# Patient Record
Sex: Female | Born: 1960 | Race: White | Hispanic: No | Marital: Married | Smoking: Never smoker
Health system: Southern US, Community
[De-identification: ages and names within clinical notes are randomized; demographics above are authoritative.]

## PROBLEM LIST (undated history)

## (undated) DIAGNOSIS — R002 Palpitations: Secondary | ICD-10-CM

## (undated) DIAGNOSIS — R112 Nausea with vomiting, unspecified: Secondary | ICD-10-CM

## (undated) DIAGNOSIS — R0982 Postnasal drip: Secondary | ICD-10-CM

## (undated) DIAGNOSIS — K5730 Diverticulosis of large intestine without perforation or abscess without bleeding: Secondary | ICD-10-CM

## (undated) DIAGNOSIS — M1990 Unspecified osteoarthritis, unspecified site: Secondary | ICD-10-CM

## (undated) DIAGNOSIS — I8390 Asymptomatic varicose veins of unspecified lower extremity: Secondary | ICD-10-CM

## (undated) DIAGNOSIS — T8859XA Other complications of anesthesia, initial encounter: Secondary | ICD-10-CM

## (undated) DIAGNOSIS — M810 Age-related osteoporosis without current pathological fracture: Secondary | ICD-10-CM

## (undated) DIAGNOSIS — Z9889 Other specified postprocedural states: Secondary | ICD-10-CM

## (undated) DIAGNOSIS — T4145XA Adverse effect of unspecified anesthetic, initial encounter: Secondary | ICD-10-CM

## (undated) DIAGNOSIS — Z780 Asymptomatic menopausal state: Secondary | ICD-10-CM

## (undated) DIAGNOSIS — M21611 Bunion of right foot: Secondary | ICD-10-CM

## (undated) DIAGNOSIS — J40 Bronchitis, not specified as acute or chronic: Secondary | ICD-10-CM

## (undated) DIAGNOSIS — R51 Headache: Secondary | ICD-10-CM

## (undated) HISTORY — PX: BUNIONECTOMY: SHX129

## (undated) HISTORY — DX: Diverticulosis of large intestine without perforation or abscess without bleeding: K57.30

## (undated) HISTORY — DX: Unspecified osteoarthritis, unspecified site: M19.90

## (undated) HISTORY — DX: Asymptomatic menopausal state: Z78.0

## (undated) HISTORY — DX: Asymptomatic varicose veins of unspecified lower extremity: I83.90

## (undated) HISTORY — PX: COLONOSCOPY: SHX174

## (undated) HISTORY — DX: Headache: R51

## (undated) HISTORY — DX: Palpitations: R00.2

## (undated) HISTORY — DX: Age-related osteoporosis without current pathological fracture: M81.0

---

## 2003-05-02 HISTORY — PX: ESOPHAGOGASTRODUODENOSCOPY: SHX1529

## 2004-10-01 ENCOUNTER — Ambulatory Visit: Payer: Self-pay

## 2005-02-12 ENCOUNTER — Ambulatory Visit: Payer: Self-pay | Admitting: Family Medicine

## 2005-10-02 ENCOUNTER — Ambulatory Visit: Payer: Self-pay

## 2005-12-30 ENCOUNTER — Ambulatory Visit: Payer: Self-pay | Admitting: Unknown Physician Specialty

## 2006-10-08 ENCOUNTER — Ambulatory Visit: Payer: Self-pay

## 2007-07-06 ENCOUNTER — Other Ambulatory Visit: Payer: Self-pay

## 2007-07-06 ENCOUNTER — Observation Stay: Payer: Self-pay | Admitting: Internal Medicine

## 2007-10-21 ENCOUNTER — Ambulatory Visit: Payer: Self-pay

## 2008-03-04 DIAGNOSIS — K5730 Diverticulosis of large intestine without perforation or abscess without bleeding: Secondary | ICD-10-CM

## 2008-03-04 HISTORY — DX: Diverticulosis of large intestine without perforation or abscess without bleeding: K57.30

## 2008-03-04 HISTORY — PX: COLON SURGERY: SHX602

## 2008-07-23 ENCOUNTER — Emergency Department: Payer: Self-pay | Admitting: Emergency Medicine

## 2008-08-08 ENCOUNTER — Ambulatory Visit: Payer: Self-pay | Admitting: Unknown Physician Specialty

## 2008-10-20 ENCOUNTER — Ambulatory Visit: Payer: Self-pay | Admitting: Unknown Physician Specialty

## 2008-11-30 ENCOUNTER — Ambulatory Visit: Payer: Self-pay

## 2009-01-09 ENCOUNTER — Ambulatory Visit: Payer: Self-pay | Admitting: Surgery

## 2009-01-16 ENCOUNTER — Inpatient Hospital Stay: Payer: Self-pay | Admitting: Surgery

## 2009-11-14 ENCOUNTER — Ambulatory Visit: Payer: Self-pay | Admitting: Family Medicine

## 2009-12-25 ENCOUNTER — Ambulatory Visit: Payer: Self-pay

## 2010-01-08 ENCOUNTER — Ambulatory Visit: Payer: Self-pay

## 2011-02-05 ENCOUNTER — Ambulatory Visit: Payer: Self-pay

## 2011-02-06 ENCOUNTER — Ambulatory Visit: Payer: Self-pay

## 2011-10-08 ENCOUNTER — Ambulatory Visit: Payer: Self-pay | Admitting: Family Medicine

## 2012-06-15 ENCOUNTER — Ambulatory Visit: Payer: Self-pay | Admitting: Family Medicine

## 2013-02-26 ENCOUNTER — Ambulatory Visit: Payer: Self-pay | Admitting: Family Medicine

## 2013-03-29 ENCOUNTER — Ambulatory Visit (INDEPENDENT_AMBULATORY_CARE_PROVIDER_SITE_OTHER): Payer: 59 | Admitting: Cardiovascular Disease

## 2013-03-29 ENCOUNTER — Encounter: Payer: Self-pay | Admitting: Cardiovascular Disease

## 2013-03-29 VITALS — BP 128/88 | HR 80 | Ht 62.0 in | Wt 145.5 lb

## 2013-03-29 DIAGNOSIS — R0602 Shortness of breath: Secondary | ICD-10-CM

## 2013-03-29 DIAGNOSIS — R002 Palpitations: Secondary | ICD-10-CM | POA: Insufficient documentation

## 2013-03-29 DIAGNOSIS — E669 Obesity, unspecified: Secondary | ICD-10-CM

## 2013-03-29 NOTE — Assessment & Plan Note (Signed)
We have encouraged continued exercise, careful diet management in an effort to lose weight. 

## 2013-03-29 NOTE — Patient Instructions (Addendum)
You are doing well. No medication changes were made.  Everything today looks great Please call the office if you have worsening palpitations We could order a holter monitor (48 hour monitor or 30 day monitor) If symptoms get bad, we could try propranolol  Download the apps called: cardiograph, instant heart rate  For cholesterol, you could try Red Yeast Rice  Please call us if you have new issues that need to be addressed before your next appt.

## 2013-03-29 NOTE — Progress Notes (Signed)
   Patient ID: Albin Fellingatalie Dains, female    DOB: 08-18-1960, 53 y.o.   MRN: [id number]  HPI Comments: Ms. Jeanella Crazeierce is a very pleasant 53 year old woman with history of obesity, borderline diabetes who presents for evaluation of palpitations that typically occur at nighttime.  She reports that she recently stopped her birth control medication abruptly at the beginning of January 2015. One week ago, she was woken up several mites and around with a pounding heart rate associated with some shortness of breath. The first episode she walks around her house for half an hour until symptoms resolved. Seconds night was less severe, third night was very quick in recovery. She describes the rhythm is regular, very strong. Rapid but unable to determine how fast .In general she is very active, works out at [employer] 3 days per week at a high level of aerobic activity. She's been doing so since January 2011. She has no new symptoms with exertion when working out with her trainer. She reports having a good exercise tolerance with no shortness of breath with exertion. No significant chest pain.  She denies any smoking history, reports being told she was borderline glucose intolerant. She states her father died of MI at age 572. As also a family history of atrial fibrillation Recently told that her liver enzymes were mildly elevated.  EKG shows normal sinus rhythm with rate 80 beats a minute, no significant ST or T wave changes. Most recent lab work is not available but she does report normal TSH. Lipid panel mild available    Medications: Currently not on any medications. Birth control stopped recently  Review of Systems  Constitutional: Negative.   HENT: Negative.   Eyes: Negative.   Respiratory: Negative.   Cardiovascular: Positive for palpitations.  Gastrointestinal: Negative.   Endocrine: Negative.   Musculoskeletal: Negative.   Skin: Negative.   Allergic/Immunologic: Negative.   Neurological: Negative.    Hematological: Negative.   Psychiatric/Behavioral: Negative.   All other systems reviewed and are negative.    BP 128/88  Pulse 80  Ht 5\' 2"  (1.575 m)  Wt 145 lb 8 oz (65.998 kg)  BMI 26.61 kg/m2  Physical Exam  Nursing note and vitals reviewed. Constitutional: She is oriented to person, place, and time. She appears well-developed and well-nourished.  HENT:  Head: Normocephalic.  Nose: Nose normal.  Mouth/Throat: Oropharynx is clear and moist.  Eyes: Conjunctivae are normal. Pupils are equal, round, and reactive to light.  Neck: Normal range of motion. Neck supple. No JVD present.  Cardiovascular: Normal rate, regular rhythm, S1 normal, S2 normal, normal heart sounds and intact distal pulses.  Exam reveals no gallop and no friction rub.   No murmur heard. Pulmonary/Chest: Effort normal and breath sounds normal. No respiratory distress. She has no wheezes. She has no rales. She exhibits no tenderness.  Abdominal: Soft. Bowel sounds are normal. She exhibits no distension. There is no tenderness.  Musculoskeletal: Normal range of motion. She exhibits no edema and no tenderness.  Lymphadenopathy:    She has no cervical adenopathy.  Neurological: She is alert and oriented to person, place, and time. Coordination normal.  Skin: Skin is warm and dry. No rash noted. No erythema.  Psychiatric: She has a normal mood and affect. Her behavior is normal. Judgment and thought content normal.    Assessment and Plan

## 2013-03-29 NOTE — Assessment & Plan Note (Addendum)
Symptoms discussed with the patient. Primary care physician note also noted . Unable to exclude recent palpitations from stopping her birth control medication. Currently feels well for the past week with no recurrent symptoms. She's not interested in medications at this time for rhythm/rate control. If she feels well, this is likely not needed. We did offer her Holter monitor or if symptoms recur sporadically , 30 day event monitor. She will call us if symptoms recur. I suspect she may have had either sinus tachycardia or atrial tachycardia. Both could be treated with low-dose beta blockers if symptoms return. Unable to exclude other arrhythmia such as atrial fibrillation or ectopy , but she does report rhythm was regular.   Low risk of CAD given no smoking, no significant diabetes, self-reported reasonable cholesterol level. she does have good exercise tolerance with no symptoms .We did suggest if cholesterol does run high, she work on  continued weight loss and  exercise. She does not want  cholesterol medication but did offer that she could start red yeast rice which  can be bought over-the-counter.

## 2013-11-16 ENCOUNTER — Ambulatory Visit: Payer: Self-pay | Admitting: Obstetrics and Gynecology

## 2013-11-16 LAB — COMPREHENSIVE METABOLIC PANEL
ALBUMIN: 3.9 g/dL (ref 3.4–5.0)
ALK PHOS: 107 U/L
AST: 25 U/L (ref 15–37)
Anion Gap: 6 — ABNORMAL LOW (ref 7–16)
BILIRUBIN TOTAL: 0.5 mg/dL (ref 0.2–1.0)
BUN: 16 mg/dL (ref 7–18)
CALCIUM: 10 mg/dL (ref 8.5–10.1)
CHLORIDE: 106 mmol/L (ref 98–107)
CREATININE: 0.75 mg/dL (ref 0.60–1.30)
Co2: 26 mmol/L (ref 21–32)
EGFR (African American): >60
Glucose: 93 mg/dL (ref 65–99)
OSMOLALITY: 277 (ref 275–301)
Potassium: 4 mmol/L (ref 3.5–5.1)
SGPT (ALT): 30 U/L
Sodium: 138 mmol/L (ref 136–145)
Total Protein: 7.3 g/dL (ref 6.4–8.2)

## 2013-11-16 LAB — CBC
HCT: 41.9 % (ref 35.0–47.0)
HGB: 14.2 g/dL (ref 12.0–16.0)
MCH: 30.2 pg (ref 26.0–34.0)
MCHC: 33.8 g/dL (ref 32.0–36.0)
MCV: 89 fL (ref 80–100)
Platelet: 208 10*3/uL (ref 150–440)
RBC: 4.7 10*6/uL (ref 3.80–5.20)
RDW: 14.1 % (ref 11.5–14.5)
WBC: 7.8 10*3/uL (ref 3.6–11.0)

## 2013-12-02 HISTORY — PX: DILATION AND CURETTAGE OF UTERUS: SHX78

## 2013-12-24 ENCOUNTER — Ambulatory Visit: Payer: Self-pay | Admitting: Obstetrics and Gynecology

## 2014-06-25 NOTE — Op Note (Signed)
PATIENT NAME:  Judy Butler, Judy Butler MR#:  [id number] DATE OF BIRTH:  [birth year]  DATE OF PROCEDURE:  12/24/2013  PREOPERATIVE DIAGNOSES:  1. Postmenopausal bleeding.  2. Suspected endometrial polyps.  3. Cervical stenosis.  POSTOPERATIVE DIAGNOSES: 1. Postmenopausal bleeding.  2. Suspected endometrial polyps.  3. Cervical stenosis.  PROCEDURES:   1. Dilation and curettage.  2. Hysteroscopy.  3. Polypectomy.   ANESTHESIA: General.   SURGEON: Conard NovakStephen D. Perla Echavarria, MD.   ESTIMATED BLOOD LOSS: 50 mL.  OPERATIVE FLUIDS: 800 mL crystalloid.   COMPLICATIONS: None.   FINDINGS:  1. Two to 3 broad-based polypoid lesions on the posterior uterine wall.  2. Otherwise normal-appearing uterine cavity.  3. Stenotic external cervical os.   SPECIMENS: Endometrial curettings and polyps.  CONDITION AT END OF PROCEDURE: Stable.  FLUID DEFICIT: 800 mL.   PROCEDURE IN DETAIL: The patient was taken to the operating room where general anesthesia was administered and found to be adequate. The patient was placed in the dorsal supine high lithotomy position in candy cane stirrups and prepped and draped in the usual sterile fashion. After a timeout was called, the patient's bladder was emptied via in and out catheter with return of approximately 100 mL of clear urine.  A sterile speculum was placed in the vagina and a single-tooth tenaculum was affixed to the anterior lip of the cervix. The cervix was dilated serially using Hegar dilators to a dilatation of 7 mm. The MyoSure hysteroscope was gently introduced through the cervix into the uterine cavity with the above-noted findings. The MyoSure device was attached to the hysteroscope with removal of all polypoid lesions. The hysteroscope was removed and a gentle curettage was performed for a global sample throughout the uterus. The hysteroscope was reintroduced to assess sufficiency of removal of contents of the uterus as well as hemostasis. The pressure of the  intrauterine fluid was slowly reduced to assure hemostasis. Hemostasis was noted at the end of the procedure. The hysteroscope was removed. The tenaculum was removed from the anterior lip of the cervix, and hemostasis was noted at the cervix. All instrumentation was removed and verified to be free and out of the vagina.   The patient tolerated the procedure well. Sponge, lap, and needle counts were correct x2. For VTE prophylaxis, the patient was wearing pneumatic compression stockings throughout the entire procedure. Total surgical time was approximately 25 minutes. The patient was taken to the recovery area in stable condition.   ____________________________ Conard NovakStephen D. Rayshawn Visconti, MD sdj:jh D: 12/24/2013 10:21:24 ET T: 12/24/2013 11:29:34 ET JOB#: [id number]  cc: Conard NovakStephen D. Reiana Poteet, MD, <Dictator> Conard NovakSTEPHEN D Jayliana Valencia MD ELECTRONICALLY SIGNED 01/13/2014 10:55

## 2014-06-27 LAB — SURGICAL PATHOLOGY

## 2014-11-08 ENCOUNTER — Encounter: Payer: Self-pay | Admitting: Family Medicine

## 2014-11-08 ENCOUNTER — Other Ambulatory Visit: Payer: Self-pay

## 2014-11-08 ENCOUNTER — Ambulatory Visit
Admission: RE | Admit: 2014-11-08 | Discharge: 2014-11-08 | Disposition: A | Discharge status: Home / Self Care | Payer: 59 | Source: Ambulatory Visit | Attending: Family Medicine | Admitting: Family Medicine

## 2014-11-08 ENCOUNTER — Ambulatory Visit (INDEPENDENT_AMBULATORY_CARE_PROVIDER_SITE_OTHER): Payer: 59 | Admitting: Family Medicine

## 2014-11-08 ENCOUNTER — Other Ambulatory Visit: Payer: Self-pay | Admitting: Family Medicine

## 2014-11-08 VITALS — BP 124/70 | HR 89 | Temp 98.5°F | Resp 16 | Ht 63.0 in | Wt 130.4 lb

## 2014-11-08 DIAGNOSIS — M810 Age-related osteoporosis without current pathological fracture: Secondary | ICD-10-CM | POA: Diagnosis not present

## 2014-11-08 DIAGNOSIS — M25552 Pain in left hip: Secondary | ICD-10-CM | POA: Insufficient documentation

## 2014-11-08 DIAGNOSIS — X58XXXA Exposure to other specified factors, initial encounter: Secondary | ICD-10-CM | POA: Diagnosis not present

## 2014-11-08 DIAGNOSIS — S3992XA Unspecified injury of lower back, initial encounter: Secondary | ICD-10-CM

## 2014-11-08 DIAGNOSIS — R030 Elevated blood-pressure reading, without diagnosis of hypertension: Secondary | ICD-10-CM | POA: Diagnosis not present

## 2014-11-08 DIAGNOSIS — M150 Primary generalized (osteo)arthritis: Secondary | ICD-10-CM

## 2014-11-08 DIAGNOSIS — M25551 Pain in right hip: Secondary | ICD-10-CM | POA: Insufficient documentation

## 2014-11-08 DIAGNOSIS — M159 Polyosteoarthritis, unspecified: Secondary | ICD-10-CM

## 2014-11-08 DIAGNOSIS — K5732 Diverticulitis of large intestine without perforation or abscess without bleeding: Secondary | ICD-10-CM | POA: Insufficient documentation

## 2014-11-08 MED ORDER — MELOXICAM 15 MG PO TABS: 15.0000 mg | ORAL_TABLET | Freq: Every day | ORAL | Status: DC

## 2014-11-08 NOTE — Progress Notes (Signed)
Name: Judy Butler   MRN: [id number]    DOB: 10/19/1960   Date:11/08/2014       Progress Note  Subjective  Chief Complaint  Chief Complaint  Patient presents with  . Back Pain    patient went canoing about 2 weeks ago. She fell on a rock and has had intense pain since then. patient reports lots of pressure in the lower back.  she has tried using ice packs.    HPI  Patient is here for routine follow up of previously elevated blood pressure without the diagnosis of HTN. First evidence of elevated blood pressure several years ago. Last documented BP 143/91 at her last visit with me 08/2013. She has irregular follow up. Current anti-hypertension regimen includes dietary modification, weight management and no prescription medications. She has lost 25 lbs since her previous visit with diet and exercise. Patient is following physician recommended management. Not checking blood pressure outside of physician office. Associated symptoms do not include dizziness, nausea, shortness of breath, chest pain, numbness.  Joint/Muscle Pain: Patient complains of lower back pain and bilateral hips for which has been present for 2 weeks. Pain is located in lumbar sacral area, is described as aching, throbbing, tight band and pressure, and is constant .  Associated symptoms include: decreased range of motion.  Has worked with Systems analyst 3x last week which made the discomfort worse. The patient has tried cold, heat, NSAIDs, position change and rest for pain, with minimal relief.  Related to injury:   Yes.  Canoeing about 2 weeks ago and thrown out of boat onto rock.  Denies change in walking, focal neurological deficit, pain keeping her up at night.  Does have some numbness on and off radiating from left hip down side of thigh.    Past Medical History  Diagnosis Date  . Routine general medical examination at a health care facility   . Osteoarthrosis, unspecified whether generalized or localized, unspecified  site   . Osteoporosis, unspecified   . Diverticulosis of colon (without mention of hemorrhage)   . Headache(784.0)   . Asymptomatic varicose veins   . Palpitations   . Post-menopausal     History reviewed. No pertinent past surgical history.  Family History  Problem Relation Age of Onset  . Arrhythmia Mother     A-Fib  . Heart failure Mother   . Hypertension Mother   . Hyperlipidemia Mother   . Hypertension Father   . Heart attack Father 73    MI  . Arrhythmia Brother     A-Fib  . Heart attack Paternal Grandfather 1  . Heart disease Paternal Grandfather     Social History   Social History  . Marital Status: Married    Spouse Name: N/A  . Number of Children: N/A  . Years of Education: N/A   Occupational History  . Not on file.   Social History Main Topics  . Smoking status: Never Smoker   . Smokeless tobacco: Not on file  . Alcohol Use: 0.0 oz/week    0 Standard drinks or equivalent per week     Comment: Drinks wine socially.  . Drug Use: No  . Sexual Activity:    Partners: Male   Other Topics Concern  . Not on file   Social History Narrative    No current outpatient prescriptions on file.  Allergies  Allergen Reactions  . Codeine     Hives Vomiting   . Hydrocodone-Acetaminophen Nausea Only and Nausea And Vomiting  .  Oxycodone Hcl Nausea Only and Nausea And Vomiting  . Propoxyphene Nausea Only and Nausea And Vomiting     ROS  CONSTITUTIONAL: No significant weight changes, fever, chills, weakness or fatigue.  HEENT:  - Eyes: No visual changes.  - Ears: No auditory changes. No pain.  - Nose: No sneezing, congestion, runny nose. - Throat: No sore throat. No changes in swallowing. SKIN: No rash or itching.  CARDIOVASCULAR: No chest pain, chest pressure or chest discomfort. No palpitations or edema.  RESPIRATORY: No shortness of breath, cough or sputum.  GASTROINTESTINAL: No anorexia, nausea, vomiting. No changes in bowel habits. No abdominal  pain or blood.  GENITOURINARY: No dysuria. No frequency. No discharge.  NEUROLOGICAL: No headache, dizziness, syncope, paralysis, ataxia, numbness or tingling in the extremities. No memory changes. No change in bowel or bladder control.  MUSCULOSKELETAL: Yes joint pain. No muscle pain. HEMATOLOGIC: No anemia, bleeding or bruising.  LYMPHATICS: No enlarged lymph nodes.  PSYCHIATRIC: No change in mood. No change in sleep pattern.  ENDOCRINOLOGIC: No reports of sweating, cold or heat intolerance. No polyuria or polydipsia.   Objective  Filed Vitals:   11/08/14 0837  BP: 124/70  Pulse: 89  Temp: 98.5 F (36.9 C)  TempSrc: Oral  Resp: 16  Height:  (1.6 m)  Weight: 130 lb 6.4 oz (59.149 kg)  SpO2: 97%   Body mass index is 23.11 kg/(m^2).  Physical Exam  Constitutional: Patient appears well-developed and well-nourished. In no distress.  HEENT:  - Head: Normocephalic and atraumatic.  - Ears: Bilateral TMs gray, no erythema or effusion - Nose: Nasal mucosa moist - Mouth/Throat: Oropharynx is clear and moist. No tonsillar hypertrophy or erythema. No post nasal drainage.  - Eyes: Conjunctivae clear, EOM movements normal. PERRLA. No scleral icterus.  Neck: Normal range of motion. Neck supple. No JVD present. No thyromegaly present.  Cardiovascular: Normal rate, regular rhythm and normal heart sounds.  No murmur heard.  Pulmonary/Chest: Effort normal and breath sounds normal. No respiratory distress. Musculoskeletal: Normal range of motion bilateral UE and LE, no joint effusions. Lumbar spine lower aspect paraspinal muscle tension extending over sacrum. Negative straight leg testing. Peripheral vascular: Bilateral LE no edema. Neurological: CN II-XII grossly intact with no focal deficits. Alert and oriented to person, place, and time. Coordination, balance, strength, speech and gait are normal.  Skin: Skin is warm and dry. No rash noted. No erythema.  Psychiatric: Patient has a  normal mood and affect. Behavior is normal in office today. Judgment and thought content normal in office today.   Assessment & Plan  1. Primary osteoarthritis involving multiple joints Due for repeat Dexa scan.  2. Blood pressure elevated without history of HTN Improved with lifestyle changes.  3. OP (osteoporosis) Due for repeat Dexa scan.  - DG Bone Density; Future  4. Lumbosacral injury, initial encounter We discussed potential pathology and long term risk of reoccurrence. Maintaining an ideal body habitus, regular exercise, proper lifting techniques and mindfulness of exacerbating factors will be useful in long term management.  Instructed on use of heating pad with exercises. Consider concomitant therapy with PT, massage therapist or chiropractor. May use anti-inflammatory medication and muscle relaxer as needed.  - DG Lumbar Spine Complete W/Bend; Future  5. Bilateral hip pain Will start with X-ray imaging.  - DG Arthro Hip Left; Future - DG Arthro Hip Right; Future

## 2014-11-08 NOTE — Progress Notes (Signed)
Meloxicam sent to pharmacy and referral to Dr. Yves Dill placed.

## 2014-11-08 NOTE — Addendum Note (Signed)
Addended by: Edwena Felty on: 11/08/2014 09:10 AM   Modules accepted: Orders

## 2014-11-17 ENCOUNTER — Encounter: Payer: Self-pay | Admitting: Family Medicine

## 2014-11-17 ENCOUNTER — Ambulatory Visit
Admission: RE | Admit: 2014-11-17 | Discharge: 2014-11-17 | Disposition: A | Discharge status: Home / Self Care | Payer: 59 | Source: Ambulatory Visit | Attending: Family Medicine | Admitting: Family Medicine

## 2014-11-17 DIAGNOSIS — M810 Age-related osteoporosis without current pathological fracture: Secondary | ICD-10-CM | POA: Diagnosis present

## 2014-11-18 ENCOUNTER — Telehealth: Payer: Self-pay

## 2014-11-18 NOTE — Telephone Encounter (Signed)
Patient returned my call from yesterday regarding her Dexa scan and after she verified her date of birth, results were reviewed. Patient was encouraged to take otc Calcium and Vitamin D then she could repeat the scan in a few years. Patient agreed and said thanks for the call.

## 2014-12-19 ENCOUNTER — Telehealth: Payer: Self-pay | Admitting: Family Medicine

## 2014-12-19 NOTE — Telephone Encounter (Signed)
Pt wants to know if you have received her form for her to be able to have foot surgery? Pt states she faxed in over on 12/09/14 and her surgery 01/19/15. Please advise.

## 2014-12-19 NOTE — Telephone Encounter (Signed)
I did get the form, saved it on my desk as I was waiting to see if she was going to come in for surgical clearance. They are requesting EKG and physical exam. Make appointment.

## 2014-12-19 NOTE — Telephone Encounter (Signed)
Left message for pt to call me back 

## 2014-12-20 NOTE — Telephone Encounter (Signed)
Pt has an appt.

## 2015-01-02 ENCOUNTER — Encounter: Payer: Self-pay | Admitting: Family Medicine

## 2015-01-02 ENCOUNTER — Ambulatory Visit (INDEPENDENT_AMBULATORY_CARE_PROVIDER_SITE_OTHER): Payer: 59 | Admitting: Family Medicine

## 2015-01-02 VITALS — BP 118/76 | HR 95 | Temp 98.4°F | Resp 18 | Wt 133.8 lb

## 2015-01-02 DIAGNOSIS — R002 Palpitations: Secondary | ICD-10-CM

## 2015-01-02 DIAGNOSIS — J209 Acute bronchitis, unspecified: Secondary | ICD-10-CM | POA: Diagnosis not present

## 2015-01-02 DIAGNOSIS — J069 Acute upper respiratory infection, unspecified: Secondary | ICD-10-CM | POA: Insufficient documentation

## 2015-01-02 MED ORDER — AMOXICILLIN-POT CLAVULANATE 875-125 MG PO TABS
1.0000 | ORAL_TABLET | Freq: Two times a day (BID) | ORAL | Status: DC
Start: 2015-01-02 — End: 2015-06-20

## 2015-01-02 MED ORDER — PREDNISONE 20 MG PO TABS: 20.0000 mg | ORAL_TABLET | Freq: Every day | ORAL | Status: DC

## 2015-01-02 MED ORDER — BENZONATATE 200 MG PO CAPS: 200.0000 mg | ORAL_CAPSULE | Freq: Three times a day (TID) | ORAL | Status: DC | PRN

## 2015-01-02 MED ORDER — ALBUTEROL SULFATE HFA 108 (90 BASE) MCG/ACT IN AERS: 1.0000 | INHALATION_SPRAY | Freq: Four times a day (QID) | RESPIRATORY_TRACT | Status: DC | PRN

## 2015-01-02 NOTE — Progress Notes (Addendum)
Name: Judy Butler   MRN: [id number]    DOB: 1961-01-18   Date:01/02/2015       Progress Note  Subjective  Chief Complaint  Chief Complaint  Patient presents with  . Medical Clearance    patient is scheduled to have surgery on her foot on 01/19/15  . URI    patient presents with some chest congestion that has lingered for almost 3 weeks. patient has tried otc Mucinex    HPI  Judy Butler is a 54 year old female here for surgical clearance examination to proceed with podiatry surgery: Right foot bunion correction due to worsening pain and discomfort especially with activity.   Upper Respiratory Infection: Patient complains of symptoms of a URI. Symptoms include chest congestion, tightness with cough and plugged sensation in both ears. Onset of symptoms was 3 weeks ago, unchanged since that time. She also c/o congestion and cough described as nonproductive and waxing and waning over time for the past 3 weeks .  She is drinking plenty of fluids. Evaluation to date: none. Treatment to date: cough suppressants and decongestants.     Past Medical History  Diagnosis Date  . Routine general medical examination at a health care facility   . Osteoarthrosis, unspecified whether generalized or localized, unspecified site   . Osteoporosis, unspecified   . Diverticulosis of colon (without mention of hemorrhage)   . Headache(784.0)   . Asymptomatic varicose veins   . Palpitations   . Post-menopausal     Patient Active Problem List   Diagnosis Date Noted  . Diverticulitis of colon without hemorrhage 11/08/2014  . Primary osteoarthritis involving multiple joints 11/08/2014  . Blood pressure elevated without history of HTN 11/08/2014  . Lumbosacral injury 11/08/2014  . Bilateral hip pain 11/08/2014  . Palpitations 03/29/2013  . Asymptomatic varicose veins 09/06/2009  . Accumulation of fluid in tissues 07/07/2009  . Migraine without aura 07/13/2008  . Osteopenia 04/14/2007     Social History  Substance Use Topics  . Smoking status: Never Smoker   . Smokeless tobacco: Not on file  . Alcohol Use: 0.0 oz/week    0 Standard drinks or equivalent per week     Comment: Drinks wine socially.     Current outpatient prescriptions:  .  meloxicam (MOBIC) 15 MG tablet, Take 1 tablet (15 mg total) by mouth daily., Disp: 30 tablet, Rfl: 2  History reviewed. No pertinent past surgical history.  Family History  Problem Relation Age of Onset  . Arrhythmia Mother     A-Fib  . Heart failure Mother   . Hypertension Mother   . Hyperlipidemia Mother   . Hypertension Father   . Heart attack Father 19    MI  . Arrhythmia Brother     A-Fib  . Heart attack Paternal Grandfather 16  . Heart disease Paternal Grandfather     Allergies  Allergen Reactions  . Codeine     Hives Vomiting   . Hydrocodone-Acetaminophen Nausea Only and Nausea And Vomiting  . Oxycodone Hcl Nausea Only and Nausea And Vomiting  . Propoxyphene Nausea Only and Nausea And Vomiting     Review of Systems  CONSTITUTIONAL: No significant weight changes, fever, chills, weakness or fatigue.  HEENT:  - Eyes: No visual changes.  - Ears: No auditory changes. No pain.  - Nose: Yes sneezing, congestion, runny nose. - Throat: No sore throat. No changes in swallowing. SKIN: No rash or itching.  CARDIOVASCULAR: No chest pain, chest pressure or chest discomfort.  No palpitations or edema.  RESPIRATORY: No shortness of breath. Yes cough. GASTROINTESTINAL: No anorexia, nausea, vomiting. No changes in bowel habits. No abdominal pain or blood.  GENITOURINARY: No dysuria. No frequency. No discharge. NEUROLOGICAL: No headache, dizziness, syncope, paralysis, ataxia, numbness or tingling in the extremities. No memory changes. No change in bowel or bladder control.  MUSCULOSKELETAL: No joint pain. No muscle pain. HEMATOLOGIC: No anemia, bleeding or bruising.  LYMPHATICS: No enlarged lymph nodes.  PSYCHIATRIC:  No change in mood. No change in sleep pattern.  ENDOCRINOLOGIC: No reports of sweating, cold or heat intolerance. No polyuria or polydipsia.     Objective  BP 118/76 mmHg  Pulse 95  Temp(Src) 98.4 F (36.9 C) (Oral)  Resp 18  Wt 133 lb 12.8 oz (60.691 kg)  SpO2 96% Body mass index is 23.71 kg/(m^2).  Physical Exam  Constitutional: Patient appears well-developed and well-nourished. In no distress.  HEENT:  - Head: Normocephalic and atraumatic.  - Ears: Bilateral TMs gray, no erythema or effusion - Nose: Nasal mucosa moist, boggy and congested - Mouth/Throat: Oropharynx is clear and moist. No tonsillar hypertrophy or erythema. Yes post nasal drainage.  - Eyes: Conjunctivae clear, EOM movements normal. PERRLA. No scleral icterus.  Neck: Normal range of motion. Neck supple. No JVD present. No thyromegaly present.  Cardiovascular: Normal rate, regular rhythm and normal heart sounds.  No murmur heard.  Pulmonary/Chest: Effort normal with good moving of air in all lung fields however at end expiration there is audible rhonchi at upper anterior lung fields and left mid posterior lung field. No rales or wheezing.  Musculoskeletal: Normal range of motion bilateral UE and LE, no joint effusions. Peripheral vascular: Bilateral LE no edema. Neurological: CN II-XII grossly intact with no focal deficits. Alert and oriented to person, place, and time. Coordination, balance, strength, speech and gait are normal.  Skin: Skin is warm and dry. No rash noted. No erythema.  Psychiatric: Patient has a normal mood and affect. Behavior is normal in office today. Judgment and thought content normal in office today.   Assessment & Plan  1. Palpitations Surgical clearance approved as long as Bronchitis resolved by the time of surgery. Form filled out and EKG shows NSR without any ST segment changes.  - EKG 12-Lead  2. Bronchitis, acute, with bronchospasm Will treat with abx, steroids and inhaler due  to upcoming surgery. If no improvement CXR recommended. Clinically stable, pulse ox reassuring.   - amoxicillin-clavulanate (AUGMENTIN) 875-125 MG tablet; Take 1 tablet by mouth 2 (two) times daily.  Dispense: 20 tablet; Refill: 0 - albuterol (PROVENTIL HFA;VENTOLIN HFA) 108 (90 BASE) MCG/ACT inhaler; Inhale 1-2 puffs into the lungs every 6 (six) hours as needed for wheezing or shortness of breath.  Dispense: 3.7 g; Refill: 0 - predniSONE (DELTASONE) 20 MG tablet; Take 1 tablet (20 mg total) by mouth daily with breakfast.  Dispense: 3 tablet; Refill: 0 - benzonatate (TESSALON) 200 MG capsule; Take 1 capsule (200 mg total) by mouth 3 (three) times daily as needed for cough.  Dispense: 30 capsule; Refill: 0

## 2015-01-02 NOTE — Addendum Note (Signed)
Addended by: Edwena FeltySUNDARAM, [place]ope [place] on: 01/02/2015 10:09 AM   Modules accepted: Kipp BroodSmartSet

## 2015-01-18 NOTE — Discharge Instructions (Signed)
[facility] °[facility] ° °POST OPERATIVE INSTRUCTIONS FOR DR. TROXLER AND DR. FOWLER °[facility] ° ° °1. Take your medication as prescribed.  Pain medication should be taken only as needed. ° °2. Keep the dressing clean, dry and intact. ° °3. Keep your foot elevated above the heart level for the first 48 hours. ° °4. Walking to the bathroom and brief periods of walking are acceptable, unless we have instructed you to be non-weight bearing. ° °5. Always wear your post-op shoe when walking.  Always use your crutches if you are to be non-weight bearing. ° °6. Do not take a shower. Baths are permissible as long as the foot is kept out of the water.  ° °7. Every hour you are awake:  °- Bend your knee 15 times. °- Flex foot 15 times °- Massage calf 15 times ° °8. Call [facility] ([phone]) if any of the following problems occur: °- You develop a temperature or fever. °- The bandage becomes saturated with blood. °- Medication does not stop your pain. °- Injury of the foot occurs. °- Any symptoms of infection including redness, odor, or red streaks running from wound. ° °General Anesthesia, Adult, Care After °Refer to this sheet in the next few weeks. These instructions provide you with information on caring for yourself after your procedure. Your health care provider may also give you more specific instructions. Your treatment has been planned according to current medical practices, but problems sometimes occur. Call your health care provider if you have any problems or questions after your procedure. °WHAT TO EXPECT AFTER THE PROCEDURE °After the procedure, it is typical to experience: °· Sleepiness. °· Nausea and vomiting. °HOME CARE INSTRUCTIONS °· For the first 24 hours after general anesthesia: °¨ Have a responsible person with you. °¨ Do not drive a car. If you are alone, do not take public transportation. °¨ Do not drink alcohol. °¨ Do not take  medicine that has not been prescribed by your health care provider. °¨ Do not sign important papers or make important decisions. °¨ You may resume a normal diet and activities as directed by your health care provider. °· Change bandages (dressings) as directed. °· If you have questions or problems that seem related to general anesthesia, call the hospital and ask for the anesthetist or anesthesiologist on call. °SEEK MEDICAL CARE IF: °· You have nausea and vomiting that continue the day after anesthesia. °· You develop a rash. °SEEK IMMEDIATE MEDICAL CARE IF:  °· You have difficulty breathing. °· You have chest pain. °· You have any allergic problems. °  °This information is not intended to replace advice given to you by your health care provider. Make sure you discuss any questions you have with your health care provider. °  °Document Released: 05/27/2000 Document Revised: 03/11/2014 Document Reviewed: 06/19/2011 °Elsevier Interactive Patient Education ©2016 [employer] ° °

## 2015-01-19 ENCOUNTER — Ambulatory Visit: Payer: 59 | Admitting: Student in an Organized Health Care Education/Training Program

## 2015-01-19 ENCOUNTER — Encounter
Admission: RE | Disposition: A | Discharge status: Home / Self Care | Payer: Self-pay | Source: Ambulatory Visit | Attending: Podiatry

## 2015-01-19 ENCOUNTER — Encounter: Payer: Self-pay | Admitting: Podiatry

## 2015-01-19 ENCOUNTER — Ambulatory Visit
Admission: RE | Admit: 2015-01-19 | Discharge: 2015-01-19 | Disposition: A | Discharge status: Home / Self Care | Payer: 59 | Source: Ambulatory Visit | Attending: Podiatry | Admitting: Podiatry

## 2015-01-19 DIAGNOSIS — M8580 Other specified disorders of bone density and structure, unspecified site: Secondary | ICD-10-CM | POA: Diagnosis not present

## 2015-01-19 DIAGNOSIS — Q6621 Congenital metatarsus primus varus: Secondary | ICD-10-CM | POA: Diagnosis present

## 2015-01-19 DIAGNOSIS — X58XXXA Exposure to other specified factors, initial encounter: Secondary | ICD-10-CM | POA: Diagnosis not present

## 2015-01-19 DIAGNOSIS — Z9889 Other specified postprocedural states: Secondary | ICD-10-CM

## 2015-01-19 DIAGNOSIS — Z780 Asymptomatic menopausal state: Secondary | ICD-10-CM | POA: Insufficient documentation

## 2015-01-19 DIAGNOSIS — M1990 Unspecified osteoarthritis, unspecified site: Secondary | ICD-10-CM | POA: Diagnosis not present

## 2015-01-19 DIAGNOSIS — Z888 Allergy status to other drugs, medicaments and biological substances status: Secondary | ICD-10-CM | POA: Diagnosis not present

## 2015-01-19 DIAGNOSIS — Y929 Unspecified place or not applicable: Secondary | ICD-10-CM | POA: Diagnosis not present

## 2015-01-19 DIAGNOSIS — M2011 Hallux valgus (acquired), right foot: Secondary | ICD-10-CM | POA: Diagnosis not present

## 2015-01-19 DIAGNOSIS — M810 Age-related osteoporosis without current pathological fracture: Secondary | ICD-10-CM | POA: Diagnosis not present

## 2015-01-19 DIAGNOSIS — S93334A Other dislocation of right foot, initial encounter: Secondary | ICD-10-CM | POA: Insufficient documentation

## 2015-01-19 HISTORY — PX: WEIL OSTEOTOMY: SHX5044

## 2015-01-19 HISTORY — DX: Other complications of anesthesia, initial encounter: T88.59XA

## 2015-01-19 HISTORY — DX: Adverse effect of unspecified anesthetic, initial encounter: T41.45XA

## 2015-01-19 HISTORY — DX: Bronchitis, not specified as acute or chronic: J40

## 2015-01-19 HISTORY — PX: METATARSAL OSTEOTOMY: SHX1641

## 2015-01-19 HISTORY — DX: Postnasal drip: R09.82

## 2015-01-19 HISTORY — DX: Bunion of right foot: M21.611

## 2015-01-19 SURGERY — OSTEOTOMY, METATARSAL BONE
Anesthesia: Regional | Laterality: Right | Wound class: Clean

## 2015-01-19 MED ORDER — PROPOFOL 10 MG/ML IV BOLUS
INTRAVENOUS | Status: DC | PRN
  Administered 2015-01-19: 110 mg via INTRAVENOUS

## 2015-01-19 MED ORDER — EPHEDRINE SULFATE 50 MG/ML IJ SOLN
INTRAMUSCULAR | Status: DC | PRN
  Administered 2015-01-19 (×4): 5 mg via INTRAVENOUS

## 2015-01-19 MED ORDER — HYDROCODONE-ACETAMINOPHEN 5-325 MG PO TABS: 1.0000 | ORAL_TABLET | Freq: Four times a day (QID) | ORAL | Status: DC | PRN

## 2015-01-19 MED ORDER — METOCLOPRAMIDE HCL 5 MG/ML IJ SOLN
INTRAMUSCULAR | Status: DC | PRN
  Administered 2015-01-19: 10 mg via INTRAVENOUS

## 2015-01-19 MED ORDER — FENTANYL CITRATE (PF) 100 MCG/2ML IJ SOLN
INTRAMUSCULAR | Status: DC | PRN
  Administered 2015-01-19: 100 ug via INTRAVENOUS

## 2015-01-19 MED ORDER — PROMETHAZINE HCL 12.5 MG PO TABS: 12.5000 mg | ORAL_TABLET | Freq: Four times a day (QID) | ORAL | Status: DC | PRN

## 2015-01-19 MED ORDER — BUPIVACAINE HCL (PF) 0.5 % IJ SOLN
INTRAMUSCULAR | Status: DC | PRN
  Administered 2015-01-19: 6 mL

## 2015-01-19 MED ORDER — ONDANSETRON HCL 4 MG/2ML IJ SOLN
4.0000 mg | Freq: Once | INTRAMUSCULAR | Status: AC
  Administered 2015-01-19: 4 mg via INTRAVENOUS

## 2015-01-19 MED ORDER — LIDOCAINE HCL (CARDIAC) 20 MG/ML IV SOLN
INTRAVENOUS | Status: DC | PRN
  Administered 2015-01-19: 40 mg via INTRATRACHEAL

## 2015-01-19 MED ORDER — LACTATED RINGERS IV SOLN
INTRAVENOUS | Status: DC
  Administered 2015-01-19: 09:00:00 via INTRAVENOUS

## 2015-01-19 MED ORDER — CEFAZOLIN SODIUM-DEXTROSE 2-3 GM-% IV SOLR
2.0000 g | Freq: Once | INTRAVENOUS | Status: AC
  Administered 2015-01-19: 2 g via INTRAVENOUS

## 2015-01-19 MED ORDER — DEXAMETHASONE SODIUM PHOSPHATE 4 MG/ML IJ SOLN
INTRAMUSCULAR | Status: DC | PRN
  Administered 2015-01-19: 4 mg via INTRAVENOUS

## 2015-01-19 MED ORDER — SCOPOLAMINE 1 MG/3DAYS TD PT72
MEDICATED_PATCH | TRANSDERMAL | Status: DC | PRN
  Administered 2015-01-19: 1 via TRANSDERMAL

## 2015-01-19 MED ORDER — GLYCOPYRROLATE 0.2 MG/ML IJ SOLN
INTRAMUSCULAR | Status: DC | PRN
  Administered 2015-01-19: 0.1 mg via INTRAVENOUS

## 2015-01-19 MED ORDER — ROPIVACAINE HCL 5 MG/ML IJ SOLN
INTRAMUSCULAR | Status: DC | PRN
  Administered 2015-01-19: 35 mL via PERINEURAL

## 2015-01-19 MED ORDER — ONDANSETRON HCL 4 MG/2ML IJ SOLN
INTRAMUSCULAR | Status: DC | PRN
Start: 2015-01-19 — End: 2015-01-19
  Administered 2015-01-19: 4 mg via INTRAVENOUS

## 2015-01-19 MED ORDER — MIDAZOLAM HCL 2 MG/2ML IJ SOLN
INTRAMUSCULAR | Status: DC | PRN
  Administered 2015-01-19 (×2): 2 mg via INTRAVENOUS

## 2015-01-19 SURGICAL SUPPLY — 70 items
112829 ×3 IMPLANT
BANDAGE ELASTIC 4 CLIP NS LF (GAUZE/BANDAGES/DRESSINGS) ×3 IMPLANT
BENZOIN TINCTURE PRP APPL 2/3 (GAUZE/BANDAGES/DRESSINGS) ×3 IMPLANT
BLADE CRESCENTIC (BLADE) IMPLANT
BLADE MED AGGRESSIVE (BLADE) IMPLANT
BLADE MINI RND TIP GREEN BEAV (BLADE) IMPLANT
BLADE OSC/SAGITTAL 5.5X25 (BLADE) IMPLANT
BLADE OSC/SAGITTAL MD 5.5X18 (BLADE) ×3 IMPLANT
BLADE OSC/SAGITTAL MD 9X18.5 (BLADE) ×3 IMPLANT
BLADE OSCILLATING/SAGITTAL (BLADE) ×2
BLADE SW THK.38XMED LNG THN (BLADE) ×1 IMPLANT
BNDG ESMARK 4X12 TAN STRL LF (GAUZE/BANDAGES/DRESSINGS) ×3 IMPLANT
BNDG GAUZE 4.5X4.1 6PLY STRL (MISCELLANEOUS) ×3 IMPLANT
BNDG STRETCH 4X75 STRL LF (GAUZE/BANDAGES/DRESSINGS) ×3 IMPLANT
BUR EGG 4X8 MED (BURR) IMPLANT
BUR STRYKR EGG 5.0 (BURR) IMPLANT
CANISTER SUCT 1200ML W/VALVE (MISCELLANEOUS) ×3 IMPLANT
CAST PADDING 3X4FT ST 30246 (SOFTGOODS)
CLOSURE WOUND 1/4X4 (GAUZE/BANDAGES/DRESSINGS) ×1
COVER LIGHT HANDLE UNIVERSAL (MISCELLANEOUS) ×6 IMPLANT
COVER PIN YLW 0.028-062 (MISCELLANEOUS) IMPLANT
CUFF TOURN SGL QUICK 18 (TOURNIQUET CUFF) IMPLANT
DRAPE FLUOR MINI C-ARM 54X84 (DRAPES) ×3 IMPLANT
DRILL WIRE PASS (DRILL) IMPLANT
DURAPREP 26ML APPLICATOR (WOUND CARE) ×3 IMPLANT
GAUZE PETRO XEROFOAM 1X8 (MISCELLANEOUS) ×3 IMPLANT
GAUZE SPONGE 4X4 12PLY STRL (GAUZE/BANDAGES/DRESSINGS) ×3 IMPLANT
GLOVE BIO SURGEON STRL SZ8 (GLOVE) ×3 IMPLANT
GOWN STRL REUS W/ TWL LRG LVL3 (GOWN DISPOSABLE) ×1 IMPLANT
GOWN STRL REUS W/ TWL XL LVL3 (GOWN DISPOSABLE) ×1 IMPLANT
GOWN STRL REUS W/TWL LRG LVL3 (GOWN DISPOSABLE) ×2
GOWN STRL REUS W/TWL XL LVL3 (GOWN DISPOSABLE) ×2
K-WIRE DBL END TROCAR 6X.045 (WIRE) ×3
K-WIRE DBL END TROCAR 6X.062 (WIRE) ×3
K-WIRE TROCAR .8X100 (WIRE) ×6 IMPLANT
K-wire ×3 IMPLANT
KIT ROOM TURNOVER OR (KITS) ×3 IMPLANT
KWIRE DBL END TROCAR 6X.045 (WIRE) ×1 IMPLANT
KWIRE DBL END TROCAR 6X.062 (WIRE) ×1 IMPLANT
NEEDLE HYPO 18GX1.5 BLUNT FILL (NEEDLE) IMPLANT
NEEDLE HYPO 25GX1X1/2 BEV (NEEDLE) IMPLANT
NS IRRIG 500ML POUR BTL (IV SOLUTION) ×3 IMPLANT
PACK EXTREMITY ARMC (MISCELLANEOUS) ×3 IMPLANT
PAD CAST CTTN 3X4 STRL (SOFTGOODS) IMPLANT
PAD GROUND ADULT SPLIT (MISCELLANEOUS) ×3 IMPLANT
RASP SM TEAR CROSS CUT (RASP) ×3 IMPLANT
SCREW CANN 2.2X12 (Screw) ×3 IMPLANT
SCREW COMP SH THR 2.2X10 (Screw) ×3 IMPLANT
SPLINT CAST 1 STEP 4X30 (MISCELLANEOUS) ×3 IMPLANT
SPLINT FAST PLASTER 5X30 (CAST SUPPLIES)
SPLINT PLASTER CAST FAST 5X30 (CAST SUPPLIES) IMPLANT
STAPLE SPR MET 9X9X9 1.5 (Staple) ×3 IMPLANT
STOCKINETTE STRL 6IN 960660 (GAUZE/BANDAGES/DRESSINGS) ×3 IMPLANT
STRAP BODY AND KNEE 60X3 (MISCELLANEOUS) ×3 IMPLANT
STRIP CLOSURE SKIN 1/4X4 (GAUZE/BANDAGES/DRESSINGS) ×2 IMPLANT
SUT ETHILON 4-0 (SUTURE) ×2
SUT ETHILON 4-0 FS2 18XMFL BLK (SUTURE) ×1
SUT ETHILON 5-0 FS-2 18 BLK (SUTURE) ×3 IMPLANT
SUT VIC AB 1 CT1 36 (SUTURE) IMPLANT
SUT VIC AB 2-0 CT1 27 (SUTURE)
SUT VIC AB 2-0 CT1 TAPERPNT 27 (SUTURE) IMPLANT
SUT VIC AB 2-0 SH 27 (SUTURE)
SUT VIC AB 2-0 SH 27XBRD (SUTURE) IMPLANT
SUT VIC AB 3-0 SH 27 (SUTURE)
SUT VIC AB 3-0 SH 27X BRD (SUTURE) IMPLANT
SUT VIC AB 4-0 FS2 27 (SUTURE) ×3 IMPLANT
SUT VICRYL AB 3-0 FS1 BRD 27IN (SUTURE) ×3 IMPLANT
SUTURE ETHLN 4-0 FS2 18XMF BLK (SUTURE) ×1 IMPLANT
SYRINGE 10CC LL (SYRINGE) IMPLANT
k-wire ×3 IMPLANT

## 2015-01-19 NOTE — Anesthesia Postprocedure Evaluation (Signed)
  Anesthesia Post-op Note  Patient: Judy Butler  Procedure(s) Performed: Procedure(s) with comments: METATARSAL OSTEOTOMY DOUBLE 1ST MET & GREAT TOE PROXIMAL PHALANX (Right) - LMA WITH LOCAL WEIL OSTEOTOMY 2ND TOE (Right)  Anesthesia type:Regional, General LMA  Patient location: PACU  Post pain: Pain level controlled  Post assessment: Post-op Vital signs reviewed, Patient's Cardiovascular Status Stable, Respiratory Function Stable, Patent Airway and No signs of Nausea or vomiting  Post vital signs: Reviewed and stable  Last Vitals:  Filed Vitals:   01/19/15 1150  BP:   Pulse: 93  Temp:   Resp: 17    Level of consciousness: awake, alert  and patient cooperative  Complications: No apparent anesthesia complications

## 2015-01-19 NOTE — Progress Notes (Signed)
Discharge instructions given to patient responsible party with verbalized understanding.  

## 2015-01-19 NOTE — Transfer of Care (Signed)
Immediate Anesthesia Transfer of Care Note  Patient: FARRIS BLASH  Procedure(s) Performed: Procedure(s) with comments: METATARSAL OSTEOTOMY DOUBLE 1ST MET & GREAT TOE PROXIMAL PHALANX (Right) - LMA WITH LOCAL WEIL OSTEOTOMY 2ND TOE (Right)  Patient Location: PACU  Anesthesia Type: Regional, General LMA  Level of Consciousness: awake, alert  and patient cooperative  Airway and Oxygen Therapy: Patient Spontanous Breathing and Patient connected to supplemental oxygen  Post-op Assessment: Post-op Vital signs reviewed, Patient's Cardiovascular Status Stable, Respiratory Function Stable, Patent Airway and No signs of Nausea or vomiting  Post-op Vital Signs: Reviewed and stable  Complications: No apparent anesthesia complications

## 2015-01-19 NOTE — Op Note (Signed)
Operative note   Surgeon: Dr. Albertine Patricia, DPM.    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    Procedure:   1. Hallux valgus repair with double osteotomy consisting of proximal phalanx osteotomy was staple fixation and first metatarsal osteotomy with double K wire fixation   2. Osteotomy second metatarsal with 2 screw fixation   3. Cleanup of osteoarthritic first metatarsal head     EBL: Less than 10 cc    Anesthesia:general with popliteal block    Hemostasis: Ankle tourniquet 250 mils mercury pressure    Specimen: None    Complications: None    Operative indications: Chronic pain and deformity to the right foot    Procedure:  Patient was brought into the OR and placed on the operating table in thesupine position. After anesthesia was obtained theright lower extremity was prepped and draped in usual sterile fashion.  Operative Report: At this time attention was directed to the first metatarsal phalangeal joint dorsally where a 6 and a linear incision was made and deepened sharp and blunt dissection. Bleeders were clamped and bovied as required. Capsular tissue was identified and incised longitudinally. This was freed from the first metatarsal head dorsally, medially, and laterally. A large medial and dorsal medial eminence of bone was noted this was resected and rasped smoothly. At this time the head of the first metatarsal was inspected and there was a large area a proximally one third of the articular cartilage was diminished and eroded to the metatarsal head this was centrally located. Denuded areas of subchondral bone were microfractured with a 0.5 K wire tip to promote bleeding and fibrocartilage  development. This time an osteotomy was performed in the first metatarsal and a mediolateral fashion V-shaped with apex distal base proximal. A 0.5 K wire was used for an apical axis guide to allow for plantar movement of the head with shifting and also some shortening of the head was shifting as the first metatarsal is notably long. After the cut was made the K wire was removed and attention was directed lateral aspect of the joint where an abductor tendon release trigger sesamoidal ligament release and lateral capsulotomy were performed. Head of metatarsals and transposed to a more medial position and fixated with a 0.062 K wire there is checked FluoroScan good position and correction were noted. A second 0.5 K wire was run across the osteotomy for increased stability. This time the medial shelf was resected and rasped smoothly. A medial capsulorrhaphy was then performed and closed with 3-0 Vicryl simple interrupted sutures. There is checked FluoroScan good position correction were noted. Abduction of the great toe was still noted so proximal phalanx osteotomy was performed in the proximal to midshaft region in a V fashion with apex lateral base medial this wedge of bone was then removed and feathered and closed and fixated with a bone staple this was seen to holding good position. After copious  irrigation the remaining capsular periosteal tissues closed with 4 Vicryl in continuous stitch while deep superficial fascial layers were closed in similar fashion. Skin was closed with 4 Vicryl in a subcuticular stitch.  This time to his directed to the second metatarsal head and distal shaft. A 3 semilunar incision made over this region deepened with sharp and blunt dissection. Bleeders clamped and bovied as required. The periosteum Tissue was then incised longitudinally and reflected mediolaterally. An osteotomy was then performed starting at the osteochondral junction from dorsal distal to plantar proximal. The  second cut was made to remove this thin wafer of bone allow for some dorsiflexion of the metatarsal head. There is an fixated with 2 K wires from the 2.2 met Artis screw set. This was checked FluoroScan good position and correction were noted. 2 of the met Artis screws were placed across the osteotomy for stabilization. FluoroScan images showed stable osteotomy and good length pattern to the metatarsals. There is capsulae irrigated and capsule tissue was enclosed with 4 Vicryl in continuous stitch as were deep and superficial fascial layers. Skin closed with 4 Vicryl subcuticular stitch.  At this time the area was blocked 0.5% Marcaine plain and dressed with sterile compressive dressing consisting of Steri-Strips Xeroform gauze 4 x 4's Kling and Kerlix. The tourniquet was released and prompt complete vascularity was seen to return all digits of the right foot. A posterior splint was placed on the right foot and leg in the operating room.       Patient tolerated the procedure and anesthesia well.  Was transported from the OR to the PACU with all vital signs stable and vascular status intact. To be discharged per routine protocol.  Will follow up in approximately 1 week in the outpatient clinic.

## 2015-01-19 NOTE — H&P (Signed)
H and P has been reviewed and no changes are noted.  

## 2015-01-19 NOTE — Anesthesia Procedure Notes (Addendum)
Anesthesia Regional Block:  Popliteal block  Pre-Anesthetic Checklist: ,, timeout performed, Correct Patient, Correct Site, Correct Laterality, Correct Procedure, Correct Position, site marked, Risks and benefits discussed,  Surgical consent,  Pre-op evaluation,  At surgeon's request and post-op pain management  Laterality: Right  Prep: chloraprep       Needles:  Injection technique: Single-shot  Needle Type: Echogenic Needle     Needle Length: 9cm 9 cm Needle Gauge: 21 and 21 G    Additional Needles:  Procedures: ultrasound guided (picture in chart) Popliteal block Narrative:  Start time: 01/19/2015 9:07 AM End time: 01/19/2015 9:14 AM Injection made incrementally with aspirations every 5 mL.  Performed by: Personally  Anesthesiologist: Ranee GosselinSTELLA, Michaelanthony Kempton  Additional Notes: Functioning IV was confirmed and monitors applied. Ultrasound guidance: relevant anatomy identified, needle position confirmed, local anesthetic spread visualized around nerve(s)., vascular puncture avoided.  Image printed for medical record.  Negative aspiration and no paresthesias; incremental administration of local anesthetic. The patient tolerated the procedure well. Vitals signes recorded in RN notes.  25 ml to popliteal, and 10 ml to adductor canal.   Procedure Name: LMA Insertion Date/Time: 01/19/2015 9:54 AM Performed by: Jimmy PicketAMYOT, Shaley Leavens Pre-anesthesia Checklist: Patient identified, Emergency Drugs available, Suction available, Timeout performed and Patient being monitored Patient Re-evaluated:Patient Re-evaluated prior to inductionOxygen Delivery Method: Circle system utilized Preoxygenation: Pre-oxygenation with 100% oxygen Intubation Type: IV induction LMA: LMA inserted LMA Size: 4.0 Number of attempts: 1 Placement Confirmation: positive ETCO2 and breath sounds checked- equal and bilateral Tube secured with: Tape

## 2015-01-19 NOTE — Anesthesia Preprocedure Evaluation (Signed)
Anesthesia Evaluation  Patient identified by MRN, date of birth, ID band  Reviewed: Allergy & Precautions, H&P , NPO status , Patient's Chart, lab work & pertinent test results  History of Anesthesia Complications Negative for: history of anesthetic complications  Airway Mallampati: II  TM Distance: >3 FB Neck ROM: full    Dental no notable dental hx.    Pulmonary    Pulmonary exam normal        Cardiovascular  Rhythm:regular Rate:Normal     Neuro/Psych  Headaches,    GI/Hepatic   Endo/Other    Renal/GU      Musculoskeletal   Abdominal   Peds  Hematology   Anesthesia Other Findings   Reproductive/Obstetrics                             Anesthesia Physical Anesthesia Plan  ASA: II  Anesthesia Plan: Regional and General LMA   Post-op Pain Management: GA combined w/ Regional for post-op pain   Induction:   Airway Management Planned:   Additional Equipment:   Intra-op Plan:   Post-operative Plan:   Informed Consent: I have reviewed the patients History and Physical, chart, labs and discussed the procedure including the risks, benefits and alternatives for the proposed anesthesia with the patient or authorized representative who has indicated his/her understanding and acceptance.     Plan Discussed with: CRNA  Anesthesia Plan Comments:         Anesthesia Quick Evaluation

## 2015-01-20 ENCOUNTER — Encounter: Payer: Self-pay | Admitting: Podiatry

## 2015-03-29 ENCOUNTER — Encounter: Payer: Self-pay | Admitting: Family Medicine

## 2015-06-20 ENCOUNTER — Ambulatory Visit (INDEPENDENT_AMBULATORY_CARE_PROVIDER_SITE_OTHER): Payer: 59 | Admitting: Family Medicine

## 2015-06-20 ENCOUNTER — Encounter: Payer: Self-pay | Admitting: Family Medicine

## 2015-06-20 VITALS — BP 116/77 | HR 123 | Temp 99.8°F | Resp 19 | Ht 62.0 in | Wt 139.0 lb

## 2015-06-20 DIAGNOSIS — S30860A Insect bite (nonvenomous) of lower back and pelvis, initial encounter: Secondary | ICD-10-CM

## 2015-06-20 DIAGNOSIS — W57XXXA Bitten or stung by nonvenomous insect and other nonvenomous arthropods, initial encounter: Secondary | ICD-10-CM | POA: Diagnosis not present

## 2015-06-20 DIAGNOSIS — J029 Acute pharyngitis, unspecified: Secondary | ICD-10-CM

## 2015-06-20 LAB — POCT RAPID STREP A (OFFICE): RAPID STREP A SCREEN: NEGATIVE

## 2015-06-20 MED ORDER — DOXYCYCLINE HYCLATE 100 MG PO TABS: 100.0000 mg | ORAL_TABLET | Freq: Two times a day (BID) | ORAL | Status: DC

## 2015-06-20 NOTE — Progress Notes (Signed)
Name: Judy Butler   MRN: [id number]    DOB: 1961-01-20   Date:06/20/2015       Progress Note  Subjective  Chief Complaint  Chief Complaint  Patient presents with  . Acute Visit    Rash/tick bite    HPI  Tick Bite: Pt. Pulled out a tick from her left lower back 17 days ago, initially pulled out a tick's body and then its head. Since yesterday, she feels 'bad'. Had itching all over her body yesterday and last night and also noticed rash on her arms, back chest and neck. This morning, she had a low grade fever and felt fatigued.    Past Medical History  Diagnosis Date  . Routine general medical examination at a health care facility   . Osteoporosis, unspecified   . Diverticulosis of colon (without mention of hemorrhage) 2010  . Headache(784.0)   . Asymptomatic varicose veins   . Palpitations   . Post-menopausal   . Bunion of right foot   . Post-nasal drainage   . Bronchitis   . Osteoarthrosis, unspecified whether generalized or localized, unspecified site     hands  . Complication of anesthesia     stopped breathing in recovery room from morphine    Past Surgical History  Procedure Laterality Date  . Esophagogastroduodenoscopy  05/02/2003  . Colonoscopy  06/07/14,08/08/08,05/02/03,11/25/94  . Colon surgery  2010    sigmoid resection/rectopexy  . Dilation and curettage of uterus  10/15    diagnostic/therapeutic  . Metatarsal osteotomy Right 01/19/2015    Procedure: METATARSAL OSTEOTOMY DOUBLE 1ST MET & GREAT TOE PROXIMAL PHALANX;  Surgeon: Albertine Patricia, DPM;  Location: [facility];  Service: Podiatry;  Laterality: Right;  LMA WITH LOCAL  . Weil osteotomy Right 01/19/2015    Procedure: WEIL OSTEOTOMY 2ND TOE;  Surgeon: Albertine Patricia, DPM;  Location: [facility];  Service: Podiatry;  Laterality: Right;    Family History  Problem Relation Age of Onset  . Arrhythmia Mother     A-Fib  . Heart failure Mother   . Hypertension Mother   . Hyperlipidemia  Mother   . Colon polyps Mother   . Hypertension Father   . Heart attack Father 32    MI  . Arrhythmia Brother     A-Fib  . Heart attack Paternal Grandfather 51  . Heart disease Paternal Grandfather     Social History   Social History  . Marital Status: Married    Spouse Name: N/A  . Number of Children: N/A  . Years of Education: N/A   Occupational History  . Not on file.   Social History Main Topics  . Smoking status: Never Smoker   . Smokeless tobacco: Never Used  . Alcohol Use: 0.0 oz/week    0 Standard drinks or equivalent per week     Comment: Drinks wine socially./2-3 drinks per month  . Drug Use: No  . Sexual Activity:    Partners: Male   Other Topics Concern  . Not on file   Social History Narrative    No current outpatient prescriptions on file.  Allergies  Allergen Reactions  . Codeine     Hives Vomiting   . Hydrocodone-Acetaminophen Nausea Only and Nausea And Vomiting  . Oxycodone Nausea Only and Nausea And Vomiting  . Oxycodone Hcl Nausea Only and Nausea And Vomiting  . Propoxyphene Nausea Only and Nausea And Vomiting  . Propoxyphene Nausea Only and Nausea And Vomiting     Review of  Systems  Constitutional: Positive for fever and malaise/fatigue.  HENT: Negative for congestion and sore throat.   Respiratory: Negative for cough and shortness of breath.   Cardiovascular: Negative for chest pain.  Gastrointestinal: Positive for nausea. Negative for vomiting and abdominal pain.  Skin: Positive for itching and rash.     Objective  Filed Vitals:   06/20/15 0940  BP: 116/77  Pulse: 123  Temp: 99.8 F (37.7 C)  TempSrc: Oral  Resp: 19  Height: _0  (1.575 m)  Weight: 139 lb (63.05 kg)  SpO2: 96%    Physical Exam  Constitutional: She is oriented to person, place, and time and well-developed, well-nourished, and in no distress.  HENT:  Head: Normocephalic and atraumatic.  Mouth/Throat: Oropharyngeal exudate (single white spot on  right side of pharynx.) and posterior oropharyngeal erythema present.  Cardiovascular: Regular rhythm.  Tachycardia present.   Pulmonary/Chest: Breath sounds normal. She has no wheezes. She has no rhonchi.  Abdominal: Soft. Bowel sounds are normal. There is no tenderness.  Neurological: She is alert and oriented to person, place, and time.  Skin: Rash noted. Rash is macular and maculopapular. There is erythema.  Erythematous macular, pruritic rash over the right and left upper back. Erythematous, maculo-papular pruritic rash over the left flank area.  Nursing note and vitals reviewed.     Assessment & Plan  1. Tick bite of back, initial encounter [facility] serologies for tickborne illnesses, based on patient's multiple symptoms and history of tick bite, will start on doxycycline, follow-up after review of titers. - Rocky mtn spotted fvr ab, IgM-blood - Lyme Disease, IgM, Early Test w/ Rflx - Ehrlichia Antibody Panel - CBC with Differential - doxycycline (VIBRA-TABS) 100 MG tablet; Take 1 tablet (100 mg total) by mouth 2 (two) times daily.  Dispense: 14 tablet; Refill: 0 - Q Fever Abs IgG, IgM w/ reflex titer  2. Pharyngitis Rapid strep test was negative, patient reassured. - POCT rapid strep A   Adrijana Haros Asad A. [facility] 06/20/2015 10:01 AM

## 2015-06-21 ENCOUNTER — Telehealth: Payer: Self-pay | Admitting: Family Medicine

## 2015-06-21 NOTE — Telephone Encounter (Signed)
Dr Sherley BoundsSundaram patient was seen by Dr Sherryll BurgerShah on 06-20-15 she is checking status on lab work

## 2015-06-22 LAB — CBC WITH DIFFERENTIAL/PLATELET
BASOS ABS: 0 10*3/uL (ref 0.0–0.2)
Basos: 0 %
EOS (ABSOLUTE): 0 10*3/uL (ref 0.0–0.4)
Eos: 0 %
HEMOGLOBIN: 14.4 g/dL (ref 11.1–15.9)
Hematocrit: 42.8 % (ref 34.0–46.6)
IMMATURE GRANS (ABS): 0 10*3/uL (ref 0.0–0.1)
IMMATURE GRANULOCYTES: 0 %
LYMPHS: 4 %
Lymphocytes Absolute: 0.7 10*3/uL (ref 0.7–3.1)
MCH: 29.4 pg (ref 26.6–33.0)
MCHC: 33.6 g/dL (ref 31.5–35.7)
MCV: 88 fL (ref 79–97)
MONOCYTES: 4 %
Monocytes Absolute: 0.7 10*3/uL (ref 0.1–0.9)
NEUTROS PCT: 92 %
Neutrophils Absolute: 14.8 10*3/uL — ABNORMAL HIGH (ref 1.4–7.0)
Platelets: 203 10*3/uL (ref 150–379)
RBC: 4.89 x10E6/uL (ref 3.77–5.28)
RDW: 14 % (ref 12.3–15.4)
WBC: 16.2 10*3/uL — AB (ref 3.4–10.8)

## 2015-06-22 LAB — EHRLICHIA ANTIBODY PANEL
E. Chaffeensis (HME) IgM Titer: NEGATIVE
E.Chaffeensis (HME) IgG: NEGATIVE
HGE IGM TITER: NEGATIVE
HGE IgG Titer: NEGATIVE

## 2015-06-22 LAB — ROCKY MTN SPOTTED FVR AB, IGM-BLOOD: RMSF IgM: 0.48 index (ref 0.00–0.89)

## 2015-06-22 LAB — LYME, IGM, EARLY TEST/REFLEX: LYME DISEASE AB, QUANT, IGM: <0.8 index (ref 0.00–0.79)

## 2015-06-27 NOTE — Telephone Encounter (Signed)
Left voicemail asking patient to return call.

## 2015-08-15 ENCOUNTER — Encounter: Payer: Self-pay | Admitting: Family Medicine

## 2015-08-15 ENCOUNTER — Ambulatory Visit (INDEPENDENT_AMBULATORY_CARE_PROVIDER_SITE_OTHER): Payer: 59 | Admitting: Family Medicine

## 2015-08-15 VITALS — BP 116/66 | HR 113 | Temp 99.0°F | Resp 16 | Ht 62.0 in | Wt 140.1 lb

## 2015-08-15 DIAGNOSIS — J069 Acute upper respiratory infection, unspecified: Secondary | ICD-10-CM

## 2015-08-15 DIAGNOSIS — J40 Bronchitis, not specified as acute or chronic: Secondary | ICD-10-CM | POA: Diagnosis not present

## 2015-08-15 MED ORDER — DM-GUAIFENESIN ER 30-600 MG PO TB12: 1.0000 | ORAL_TABLET | Freq: Two times a day (BID) | ORAL | Status: DC

## 2015-08-15 MED ORDER — AZITHROMYCIN 250 MG PO TABS: ORAL_TABLET | ORAL | Status: DC

## 2015-08-15 MED ORDER — PREDNISONE 10 MG PO TABS: 10.0000 mg | ORAL_TABLET | Freq: Every day | ORAL | Status: DC

## 2015-08-15 MED ORDER — CALCIUM CITRATE-VITAMIN D 250-100 MG-UNIT PO TABS: 1.0000 | ORAL_TABLET | Freq: Two times a day (BID) | ORAL | Status: AC

## 2015-08-15 NOTE — Progress Notes (Signed)
Name: Judy Butler   MRN: [id number]    DOB: 05/19/1960   Date:08/15/2015       Progress Note  Subjective  Chief Complaint  Chief Complaint  Patient presents with  . URI    Onset- Friday, symptoms are worsen-patient is experiencing nasal congestion, watery eyes, bilateral ear pressure and pain, dry cough, low grade fever, and fatigue. Patient has been taking DayQuil and Nasal Spray with her last dose at 7:40 a.m. with some relief.    HPI  Bronchitis: symptoms started 4 days ago with cold symptoms, nasal congestion, watery eyes, fatigue. She states getting worse, with a cough, some chest congestion, sore throat, decrease in appetite and low grade fever. Never smoked, no history of asthma. She is taking otc medication without much help.    Patient Active Problem List   Diagnosis Date Noted  . Diverticulitis of colon without hemorrhage 11/08/2014  . Primary osteoarthritis involving multiple joints 11/08/2014  . Blood pressure elevated without history of HTN 11/08/2014  . Lumbosacral injury 11/08/2014  . Bilateral hip pain 11/08/2014  . Palpitations 03/29/2013  . Asymptomatic varicose veins 09/06/2009  . Migraine without aura 07/13/2008  . Osteopenia 04/14/2007    Past Surgical History  Procedure Laterality Date  . Esophagogastroduodenoscopy  05/02/2003  . Colonoscopy  06/07/14,08/08/08,05/02/03,11/25/94  . Colon surgery  2010    sigmoid resection/rectopexy  . Dilation and curettage of uterus  10/15    diagnostic/therapeutic  . Metatarsal osteotomy Right 01/19/2015    Procedure: METATARSAL OSTEOTOMY DOUBLE 1ST MET & GREAT TOE PROXIMAL PHALANX;  Surgeon: Matthew Troxler, DPM;  Location: [facility];  Service: Podiatry;  Laterality: Right;  LMA WITH LOCAL  . Weil osteotomy Right 01/19/2015    Procedure: WEIL OSTEOTOMY 2ND TOE;  Surgeon: Matthew Troxler, DPM;  Location: [facility];  Service: Podiatry;  Laterality: Right;    Family History  Problem Relation Age of  Onset  . Arrhythmia Mother     A-Fib  . Heart failure Mother   . Hypertension Mother   . Hyperlipidemia Mother   . Colon polyps Mother   . Hypertension Father   . Heart attack Father 72    MI  . Arrhythmia Brother     A-Fib  . Heart attack Paternal Grandfather 84  . Heart disease Paternal Grandfather     Social History   Social History  . Marital Status: Married    Spouse Name: N/A  . Number of Children: N/A  . Years of Education: N/A   Occupational History  . Not on file.   Social History Main Topics  . Smoking status: Never Smoker   . Smokeless tobacco: Never Used  . Alcohol Use: 0.0 oz/week    0 Standard drinks or equivalent per week     Comment: Drinks wine socially./2-3 drinks per month  . Drug Use: No  . Sexual Activity:    Partners: Male   Other Topics Concern  . Not on file   Social History Narrative     Current outpatient prescriptions:  .  azithromycin (ZITHROMAX Z-PAK) 250 MG tablet, Take as directed Advised patient to pick it up if not better with course of prednisone, Disp: 6 each, Rfl: 0 .  calcium-vitamin D 250-100 MG-UNIT tablet, Take 1 tablet by mouth 2 (two) times daily., Disp: 60 tablet, Rfl: 0 .  dextromethorphan-guaiFENesin (MUCINEX DM) 30-600 MG 12hr tablet, Take 1 tablet by mouth 2 (two) times daily., Disp: 60 tablet, Rfl: 0 .  predniSONE (DELTASONE)   10 MG tablet, Take 1 tablet (10 mg total) by mouth daily with breakfast., Disp: 10 tablet, Rfl: 0  Allergies  Allergen Reactions  . Codeine     Hives Vomiting   . Hydrocodone-Acetaminophen Nausea Only and Nausea And Vomiting  . Oxycodone Nausea Only and Nausea And Vomiting  . Oxycodone Hcl Nausea Only and Nausea And Vomiting  . Propoxyphene Nausea Only and Nausea And Vomiting  . Propoxyphene Nausea Only and Nausea And Vomiting     ROS  Constitutional: Negative for fever or weight change.  Respiratory: Positive for cough no  shortness of breath.   Cardiovascular: Negative for chest  pain or palpitations.  Gastrointestinal: Negative for abdominal pain, no bowel changes.  Musculoskeletal: Negative for gait problem or joint swelling.  Skin: Negative for rash.  Neurological: Negative for dizziness or headache.  No other specific complaints in a complete review of systems (except as listed in HPI above).  Objective  Filed Vitals:   08/15/15 0907  BP: 116/66  Pulse: 113  Temp: 99 F (37.2 C)  TempSrc: Oral  Resp: 16  Height: 5' 2" (1.575 m)  Weight: 140 lb 1.6 oz (63.549 kg)  SpO2: 97%    Body mass index is 25.62 kg/(m^2).  Physical Exam  Constitutional: Patient appears well-developed and well-nourished. Obese No distress.  HEENT: head atraumatic, normocephalic, pupils equal and reactive to light, ears normal TM neck supple, throat within normal limits Cardiovascular: Normal rate, regular rhythm and normal heart sounds.  No murmur heard. No BLE edema. Pulmonary/Chest: Effort normal and breath sounds normal. No respiratory distress. Abdominal: Soft.  There is no tenderness. Psychiatric: Patient has a normal mood and affect. behavior is normal. Judgment and thought content normal.  Recent Results (from the past 2160 hour(s))  Rocky mtn spotted fvr ab, IgM-blood     Status: None   Collection Time: 06/20/15 10:44 AM  Result Value Ref Range   RMSF IgM 0.48 0.00 - 0.89 index    Comment:                                  Negative        <0.90                                  Equivocal 0.90 - 1.10                                  Positive        >1.10   Lyme Disease, IgM, Early Test w/ Rflx     Status: None   Collection Time: 06/20/15 10:44 AM  Result Value Ref Range   LYME DISEASE AB, QUANT, IGM <0.80 0.00 - 0.79 index    Comment:                                 Negative         <0.80                                 Equivocal  0.80 - 1.19  Positive         >1.19  IgM levels may peak at 3-6 weeks post infection, then  gradually  decline.   Ehrlichia Antibody Panel     Status: None   Collection Time: 06/20/15 10:44 AM  Result Value Ref Range   E.Chaffeensis (HME) IgG Negative Neg:<1:64   E. Chaffeensis (HME) IgM Titer Negative Neg:<1:20    Comment: IgG titers if 1:64 or greater indicate exposure or  acute and convalescent samples showing a four-fold increase, and/or the presence of IgM indicate recent or current infection.    HGE IgG Titer Negative Neg:<1:64    Comment: HGE IgG levels are detectable 7 to 10 days post infection and persist approximately one year.    HGE IgM Titer Negative Neg:<1:20    Comment: Due to a reagent backorder, this test was performed using a different assay. The reference interval for this alternate assay is:                                        Negative      <1:64                                        Positive       1:64 or greater IgM levels usually rise 3 to 5 days post infection and fall to normal levels in approximately 30 to 60 days.   CBC with Differential     Status: Abnormal   Collection Time: 06/20/15 10:44 AM  Result Value Ref Range   WBC 16.2 (H) 3.4 - 10.8 x10E3/uL   RBC 4.89 3.77 - 5.28 x10E6/uL   Hemoglobin 14.4 11.1 - 15.9 g/dL   Hematocrit 42.8 34.0 - 46.6 %   MCV 88 79 - 97 fL   MCH 29.4 26.6 - 33.0 pg   MCHC 33.6 31.5 - 35.7 g/dL   RDW 14.0 12.3 - 15.4 %   Platelets 203 150 - 379 x10E3/uL   Neutrophils 92 %   Lymphs 4 %   Monocytes 4 %   Eos 0 %   Basos 0 %   Neutrophils Absolute 14.8 (H) 1.4 - 7.0 x10E3/uL   Lymphocytes Absolute 0.7 0.7 - 3.1 x10E3/uL   Monocytes Absolute 0.7 0.1 - 0.9 x10E3/uL   EOS (ABSOLUTE) 0.0 0.0 - 0.4 x10E3/uL   Basophils Absolute 0.0 0.0 - 0.2 x10E3/uL   Immature Granulocytes 0 %   Immature Grans (Abs) 0.0 0.0 - 0.1 x10E3/uL  POCT rapid strep A     Status: Normal   Collection Time: 06/20/15 10:51 AM  Result Value Ref Range   Rapid Strep A Screen Negative Negative     PHQ2/9: Depression screen PHQ 2/9 06/20/2015  11/08/2014  Decreased Interest 0 0  Down, Depressed, Hopeless 0 0  PHQ - 2 Score 0 0     Fall Risk: Fall Risk  06/20/2015 11/08/2014  Falls in the past year? No Yes  Number falls in past yr: - 1  Injury with Fall? - Yes     Assessment & Plan  1. Upper respiratory infection  Advised Mucinex, rest and fluids  2. Bronchitis  - predniSONE (DELTASONE) 10 MG tablet; Take 1 tablet (10 mg total) by mouth daily with breakfast.  Dispense: 10 tablet; Refill: 0 - azithromycin (ZITHROMAX Z-PAK) 250   MG tablet; Take as directed Advised patient to pick it up if not better with course of prednisone  Dispense: 6 each; Refill: 0 - dextromethorphan-guaiFENesin (MUCINEX DM) 30-600 MG 12hr tablet; Take 1 tablet by mouth 2 (two) times daily.  Dispense: 60 tablet; Refill: 0   

## 2016-02-27 LAB — HM PAP SMEAR: HM PAP: NORMAL

## 2016-02-28 ENCOUNTER — Other Ambulatory Visit: Payer: Self-pay | Admitting: Obstetrics and Gynecology

## 2016-02-28 DIAGNOSIS — Z1231 Encounter for screening mammogram for malignant neoplasm of breast: Secondary | ICD-10-CM

## 2016-03-15 ENCOUNTER — Ambulatory Visit: Payer: 59

## 2016-04-09 ENCOUNTER — Ambulatory Visit
Admission: RE | Admit: 2016-04-09 | Discharge: 2016-04-09 | Disposition: A | Discharge status: Home / Self Care | Payer: 59 | Source: Ambulatory Visit | Attending: Obstetrics and Gynecology | Admitting: Obstetrics and Gynecology

## 2016-04-09 DIAGNOSIS — Z1231 Encounter for screening mammogram for malignant neoplasm of breast: Secondary | ICD-10-CM | POA: Diagnosis present

## 2017-02-28 ENCOUNTER — Encounter: Payer: Self-pay | Admitting: Obstetrics and Gynecology

## 2017-02-28 ENCOUNTER — Ambulatory Visit (INDEPENDENT_AMBULATORY_CARE_PROVIDER_SITE_OTHER): Payer: 59 | Admitting: Obstetrics and Gynecology

## 2017-02-28 VITALS — BP 128/76 | Ht 62.0 in | Wt 151.0 lb

## 2017-02-28 DIAGNOSIS — Z1331 Encounter for screening for depression: Secondary | ICD-10-CM

## 2017-02-28 DIAGNOSIS — Z01419 Encounter for gynecological examination (general) (routine) without abnormal findings: Secondary | ICD-10-CM

## 2017-02-28 DIAGNOSIS — Z1339 Encounter for screening examination for other mental health and behavioral disorders: Secondary | ICD-10-CM | POA: Diagnosis not present

## 2017-02-28 NOTE — Progress Notes (Signed)
Routine Annual Gynecology Examination   PCP: Steele Sizer, MD  Chief Complaint  Patient presents with  . Annual Exam    History of Present Illness: Patient is a 56 y.o. No obstetric history on file. presents for annual exam. The patient has no complaints today.   Menopausal bleeding: denies  Menopausal symptoms: denies  Breast symptoms: denies  Last pap smear: 1 year ago.  Result Normal, HPV negative  Last mammogram: 10 months ago.  Result Normal   Colonoscopy:  2016  Routine lab work: through work [employer]), reports they were normal.   Past Medical History:  Diagnosis Date  . Asymptomatic varicose veins   . Bronchitis   . Bunion of right foot   . Complication of anesthesia    stopped breathing in recovery room from morphine  . Diverticulosis of colon (without mention of hemorrhage) 2010  . Headache(784.0)   . Osteoarthrosis, unspecified whether generalized or localized, unspecified site    hands  . Osteoporosis, unspecified   . Palpitations   . Post-menopausal   . Post-nasal drainage   . Routine general medical examination at a health care facility     Past Surgical History:  Procedure Laterality Date  . BUNIONECTOMY    . COLON SURGERY  2010   sigmoid resection/rectopexy  . COLONOSCOPY  06/07/14,08/08/08,05/02/03,11/25/94  . DILATION AND CURETTAGE OF UTERUS  10/15   diagnostic/therapeutic  . ESOPHAGOGASTRODUODENOSCOPY  05/02/2003  . METATARSAL OSTEOTOMY Right 01/19/2015   Procedure: METATARSAL OSTEOTOMY DOUBLE 1ST MET & GREAT TOE PROXIMAL PHALANX;  Surgeon: Albertine Patricia, DPM;  Location: [facility];  Service: Podiatry;  Laterality: Right;  LMA WITH LOCAL  . WEIL OSTEOTOMY Right 01/19/2015   Procedure: WEIL OSTEOTOMY 2ND TOE;  Surgeon: Albertine Patricia, DPM;  Location: [facility];  Service: Podiatry;  Laterality: Right;    Prior to Admission medications   Medication Sig Start Date End Date Taking? Authorizing Provider  calcium-vitamin D  250-100 MG-UNIT tablet Take 1 tablet by mouth 2 (two) times daily. 08/15/15  Yes Steele Sizer, MD    Allergies  Allergen Reactions  . Codeine     Hives Vomiting   . Hydrocodone-Acetaminophen Nausea Only and Nausea And Vomiting  . Oxycodone Nausea Only and Nausea And Vomiting  . Oxycodone Hcl Nausea Only and Nausea And Vomiting  . Propoxyphene Nausea Only and Nausea And Vomiting  . Propoxyphene Nausea Only and Nausea And Vomiting   Obstetric History: G0  Social History   Socioeconomic History  . Marital status: Married    Spouse name: Not on file  . Number of children: Not on file  . Years of education: Not on file  . Highest education level: Not on file  Social Needs  . Financial resource strain: Not on file  . Food insecurity - worry: Not on file  . Food insecurity - inability: Not on file  . Transportation needs - medical: Not on file  . Transportation needs - non-medical: Not on file  Occupational History  . Not on file  Tobacco Use  . Smoking status: Never Smoker  . Smokeless tobacco: Never Used  Substance and Sexual Activity  . Alcohol use: Yes    Alcohol/week: 0.0 oz    Comment: Drinks wine socially./2-3 drinks per month  . Drug use: No  . Sexual activity: Yes    Partners: Male    Birth control/protection: Post-menopausal  Other Topics Concern  . Not on file  Social History Narrative  . Not on file  Family History  Problem Relation Age of Onset  . Arrhythmia Mother        A-Fib  . Heart failure Mother   . Hypertension Mother   . Hyperlipidemia Mother   . Colon polyps Mother   . Hypertension Father   . Heart attack Father 48       MI  . Arrhythmia Brother        A-Fib  . Heart attack Paternal Grandfather 64  . Heart disease Paternal Grandfather   . Breast cancer Neg Hx     Review of Systems  Constitutional: Negative.   HENT: Negative.   Eyes: Negative.   Respiratory: Negative.   Cardiovascular: Negative.   Gastrointestinal: Negative.     Genitourinary: Negative.   Musculoskeletal: Negative.   Skin: Negative.   Neurological: Negative.   Psychiatric/Behavioral: Negative.      Physical Exam Vitals: BP 128/76   Ht 5' 2"  (1.575 m)   Wt 151 lb (68.5 kg)   BMI 27.62 kg/m   Physical Exam  Constitutional: She is oriented to person, place, and time. She appears well-developed and well-nourished. No distress.  Genitourinary: Uterus normal. Pelvic exam was performed with patient supine. There is no rash, tenderness, lesion or injury on the right labia. There is no rash, tenderness, lesion or injury on the left labia. No erythema, tenderness or bleeding in the vagina. No signs of injury around the vagina. No vaginal discharge found. Right adnexum does not display mass, does not display tenderness and does not display fullness. Left adnexum does not display mass, does not display tenderness and does not display fullness. Cervix does not exhibit motion tenderness, lesion, discharge or polyp.   Uterus is mobile and anteverted. Uterus is not enlarged, tender or exhibiting a mass.  HENT:  Head: Normocephalic and atraumatic.  Eyes: EOM are normal. No scleral icterus.  Neck: Normal range of motion. Neck supple. No thyromegaly present.  Cardiovascular: Normal rate and regular rhythm. Exam reveals no gallop and no friction rub.  No murmur heard. Pulmonary/Chest: Effort normal and breath sounds normal. No respiratory distress. She has no wheezes. She has no rales. Right breast exhibits no inverted nipple, no mass, no nipple discharge, no skin change and no tenderness. Left breast exhibits no inverted nipple, no mass, no nipple discharge, no skin change and no tenderness.  Abdominal: Soft. Bowel sounds are normal. She exhibits no distension and no mass. There is no tenderness. There is no rebound and no guarding.  Musculoskeletal: Normal range of motion. She exhibits no edema or tenderness.  Lymphadenopathy:    She has no cervical adenopathy.        Right: No inguinal adenopathy present.       Left: No inguinal adenopathy present.  Neurological: She is alert and oriented to person, place, and time. No cranial nerve deficit.  Skin: Skin is warm and dry. No rash noted. No erythema.  Psychiatric: She has a normal mood and affect. Her behavior is normal. Judgment normal.    Female chaperone present for pelvic and breast  portions of the physical exam  Results: AUDIT Questionnaire (screen for alcoholism): 2 PHQ-9: 0  Assessment and Plan:  56 y.o. G0 female here for routine annual gynecologic examination  Plan: Problem List Items Addressed This Visit    None    Visit Diagnoses    Women's annual routine gynecological examination    -  Primary   Screening for depression       Screening for alcoholism  Screening: -- Blood pressure screen normal -- Colonoscopy - not due -- Mammogram - Due in February. Patient to call [facility] to arrange when due. -- Weight screening: normal -- Depression screening negative (PHQ-9) -- Nutrition: normal -- cholesterol screening: per PCP -- osteoporosis screening: not due -- tobacco screening: not using -- alcohol screening: AUDIT questionnaire indicates low-risk usage. -- family history of breast cancer screening: done. not at high risk. -- no evidence of domestic violence or intimate partner violence. -- STD screening: gonorrhea/chlamydia NAAT not collected per patient request. -- pap smear not collected per ASCCP guidelines -- flu vaccine received this year through work. -- HPV vaccination series: not eligilbe   Prentice Docker, MD 02/28/2017 8:43 AM

## 2017-03-24 ENCOUNTER — Other Ambulatory Visit: Payer: Self-pay | Admitting: Obstetrics and Gynecology

## 2017-03-24 DIAGNOSIS — Z1231 Encounter for screening mammogram for malignant neoplasm of breast: Secondary | ICD-10-CM

## 2017-04-11 ENCOUNTER — Ambulatory Visit
Admission: RE | Admit: 2017-04-11 | Discharge: 2017-04-11 | Disposition: A | Discharge status: Home / Self Care | Payer: Managed Care, Other (non HMO) | Source: Ambulatory Visit | Attending: Obstetrics and Gynecology | Admitting: Obstetrics and Gynecology

## 2017-04-11 DIAGNOSIS — Z1231 Encounter for screening mammogram for malignant neoplasm of breast: Secondary | ICD-10-CM | POA: Diagnosis present

## 2017-08-21 ENCOUNTER — Emergency Department
Admission: EM | Admit: 2017-08-21 | Discharge: 2017-08-21 | Disposition: A | Discharge status: Home / Self Care | Payer: Managed Care, Other (non HMO) | Attending: Student in an Organized Health Care Education/Training Program | Admitting: Student in an Organized Health Care Education/Training Program

## 2017-08-21 ENCOUNTER — Emergency Department: Payer: Managed Care, Other (non HMO)

## 2017-08-21 ENCOUNTER — Other Ambulatory Visit: Payer: Self-pay

## 2017-08-21 DIAGNOSIS — R55 Syncope and collapse: Secondary | ICD-10-CM | POA: Diagnosis present

## 2017-08-21 LAB — BASIC METABOLIC PANEL
Anion gap: 10 (ref 5–15)
BUN: 14 mg/dL (ref 6–20)
CHLORIDE: 99 mmol/L — AB (ref 101–111)
CO2: 26 mmol/L (ref 22–32)
CREATININE: 0.68 mg/dL (ref 0.44–1.00)
Calcium: 9.6 mg/dL (ref 8.9–10.3)
Glucose, Bld: 106 mg/dL — ABNORMAL HIGH (ref 65–99)
Potassium: 3.7 mmol/L (ref 3.5–5.1)
SODIUM: 135 mmol/L (ref 135–145)

## 2017-08-21 LAB — CBC
HCT: 43 % (ref 35.0–47.0)
Hemoglobin: 14.6 g/dL (ref 12.0–16.0)
MCH: 30.4 pg (ref 26.0–34.0)
MCHC: 34 g/dL (ref 32.0–36.0)
MCV: 89.4 fL (ref 80.0–100.0)
PLATELETS: 251 10*3/uL (ref 150–440)
RBC: 4.81 MIL/uL (ref 3.80–5.20)
RDW: 14 % (ref 11.5–14.5)
WBC: 8.4 10*3/uL (ref 3.6–11.0)

## 2017-08-21 LAB — TROPONIN I

## 2017-08-21 NOTE — ED Provider Notes (Signed)
[facility] Emergency Department Provider Note    First MD Initiated Contact with Patient 08/21/17 1542     (approximate)  I have reviewed the triage vital signs and the nursing notes.   HISTORY  Chief Complaint Near Syncope    HPI Judy Butler is a 57 y.o. female no recent hospitalizations or significant past medical history presents to the ER after feeling lightheaded today.  States that she was feeling dizzy.  She states that she worked out at [employer] for 30 minutes was otherwise feeling well without any symptoms.  Had a double shot of Americana espresso as well as additional couple coffee and some fruit this morning.  She went to work at [employer] and was started feeling a little dizzy so she decided she is going to go get something else to eat.  States that on the way driving she was feeling more lightheaded and dizzy but not the SPECT room was spinning around but that she was about to faint.  Denies any chest pain or shortness of breath.  She pulled over with some improvement in symptoms.  States that she felt like she was may be having a panic attack.  States that she then went over to urgent care to be checked where she thought that maybe her blood pressure was running high.  Blood pressure was normal.  CBG was normal.  They did an EKG and said that it was abnormal and that she should be seen in the ER.  She went into her husband to drive her to the ER.  She is currently asymptomatic but does still feel little bit lightheaded.  Has not had anything to eat.  Denies any numbness or tingling.  No headache.  No blurry vision.    Past Medical History:  Diagnosis Date  . Asymptomatic varicose veins   . Bronchitis   . Bunion of right foot   . Complication of anesthesia    stopped breathing in recovery room from morphine  . Diverticulosis of colon (without mention of hemorrhage) 2010  . Headache(784.0)   . Osteoarthrosis, unspecified whether generalized or  localized, unspecified site    hands  . Osteoporosis, unspecified   . Palpitations   . Post-menopausal   . Post-nasal drainage   . Routine general medical examination at a health care facility    Family History  Problem Relation Age of Onset  . Arrhythmia Mother        A-Fib  . Heart failure Mother   . Hypertension Mother   . Hyperlipidemia Mother   . Colon polyps Mother   . Hypertension Father   . Heart attack Father 69       MI  . Arrhythmia Brother        A-Fib  . Heart attack Paternal Grandfather 35  . Heart disease Paternal Grandfather   . Breast cancer Neg Hx    Past Surgical History:  Procedure Laterality Date  . BUNIONECTOMY    . COLON SURGERY  2010   sigmoid resection/rectopexy  . COLONOSCOPY  06/07/14,08/08/08,05/02/03,11/25/94  . DILATION AND CURETTAGE OF UTERUS  10/15   diagnostic/therapeutic  . ESOPHAGOGASTRODUODENOSCOPY  05/02/2003  . METATARSAL OSTEOTOMY Right 01/19/2015   Procedure: METATARSAL OSTEOTOMY DOUBLE 1ST MET & GREAT TOE PROXIMAL PHALANX;  Surgeon: Albertine Patricia, DPM;  Location: [place];  Service: Podiatry;  Laterality: Right;  LMA WITH LOCAL  . WEIL OSTEOTOMY Right 01/19/2015   Procedure: WEIL OSTEOTOMY 2ND TOE;  Surgeon: Rodman Key  Troxler, DPM;  Location: [place];  Service: Podiatry;  Laterality: Right;   Patient Active Problem List   Diagnosis Date Noted  . Diverticulitis of colon without hemorrhage 11/08/2014  . Primary osteoarthritis involving multiple joints 11/08/2014  . Blood pressure elevated without history of HTN 11/08/2014  . Lumbosacral injury 11/08/2014  . Bilateral hip pain 11/08/2014  . Palpitations 03/29/2013  . Asymptomatic varicose veins 09/06/2009  . Migraine without aura 07/13/2008  . Osteopenia 04/14/2007      Prior to Admission medications   Medication Sig Start Date End Date Taking? Authorizing Provider  azithromycin (ZITHROMAX Z-PAK) 250 MG tablet Take as directed Advised patient to pick it up  if not better with course of prednisone Patient not taking: Reported on 02/28/2017 08/15/15   Steele Sizer, MD  calcium-vitamin D 250-100 MG-UNIT tablet Take 1 tablet by mouth 2 (two) times daily. 08/15/15   Steele Sizer, MD  dextromethorphan-guaiFENesin [facility] DM) 30-600 MG 12hr tablet Take 1 tablet by mouth 2 (two) times daily. Patient not taking: Reported on 02/28/2017 08/15/15   Steele Sizer, MD  predniSONE (DELTASONE) 10 MG tablet Take 1 tablet (10 mg total) by mouth daily with breakfast. Patient not taking: Reported on 02/28/2017 08/15/15   Steele Sizer, MD    Allergies Codeine; Hydrocodone-acetaminophen; Oxycodone; Oxycodone hcl; Propoxyphene; and Propoxyphene    Social History Social History   Tobacco Use  . Smoking status: Never Smoker  . Smokeless tobacco: Never Used  Substance Use Topics  . Alcohol use: Yes    Alcohol/week: 0.0 oz    Comment: Drinks wine socially./2-3 drinks per month  . Drug use: No    Review of Systems Patient denies headaches, rhinorrhea, blurry vision, numbness, shortness of breath, chest pain, edema, cough, abdominal pain, nausea, vomiting, diarrhea, dysuria, fevers, rashes or hallucinations unless otherwise stated above in HPI. ____________________________________________   PHYSICAL EXAM:  VITAL SIGNS: Vitals:   08/21/17 1504  BP: (!) 150/97  Pulse: 78  Resp: 18  Temp: 98 F (36.7 C)  SpO2: 100%    Constitutional: Alert and oriented.  Eyes: Conjunctivae are normal.  Head: Atraumatic. Nose: No congestion/rhinnorhea. Mouth/Throat: Mucous membranes are moist.   Neck: No stridor. Painless ROM.  Cardiovascular: Normal rate, regular rhythm. Grossly normal heart sounds.  Good peripheral circulation. Respiratory: Normal respiratory effort.  No retractions. Lungs CTAB. Gastrointestinal: Soft and nontender. No distention. No abdominal bruits. No CVA tenderness. Genitourinary: deferred Musculoskeletal: No lower extremity  tenderness nor edema.  No joint effusions. Neurologic: CN- intact.  No facial droop, Normal FNF.  Normal heel to shin.  Sensation intact bilaterally. Normal speech and language. No gross focal neurologic deficits are appreciated. No gait instability. Skin:  Skin is warm, dry and intact. No rash noted. Psychiatric: Mood and affect are normal. Speech and behavior are normal.  ____________________________________________   LABS (all labs ordered are listed, but only abnormal results are displayed)  Results for orders placed or performed during the hospital encounter of 08/21/17 (from the past 24 hour(s))  Basic metabolic panel     Status: Abnormal   Collection Time: 08/21/17  3:07 PM  Result Value Ref Range   Sodium 135 135 - 145 mmol/L   Potassium 3.7 3.5 - 5.1 mmol/L   Chloride 99 (L) 101 - 111 mmol/L   CO2 26 22 - 32 mmol/L   Glucose, Bld 106 (H) 65 - 99 mg/dL   BUN 14 6 - 20 mg/dL   Creatinine, Ser 0.68 0.44 - 1.00 mg/dL  Calcium 9.6 8.9 - 10.3 mg/dL   GFR calc non Af Amer >60 >60 mL/min   GFR calc Af Amer >60 >60 mL/min   Anion gap 10 5 - 15  CBC     Status: None   Collection Time: 08/21/17  3:07 PM  Result Value Ref Range   WBC 8.4 3.6 - 11.0 K/uL   RBC 4.81 3.80 - 5.20 MIL/uL   Hemoglobin 14.6 12.0 - 16.0 g/dL   HCT 43.0 35.0 - 47.0 %   MCV 89.4 80.0 - 100.0 fL   MCH 30.4 26.0 - 34.0 pg   MCHC 34.0 32.0 - 36.0 g/dL   RDW 14.0 11.5 - 14.5 %   Platelets 251 150 - 440 K/uL  Troponin I     Status: None   Collection Time: 08/21/17  3:07 PM  Result Value Ref Range   Troponin I <0.03 <0.03 ng/mL   ____________________________________________  EKG My review and personal interpretation at Time: 14:54   Indication: dizziness  Rate: 75  Rhythm: sinus Axis: normal Other: normal intervals, no stemi ____________________________________________  RADIOLOGY  I personally reviewed all radiographic images ordered to evaluate for the above acute complaints and reviewed radiology  reports and findings.  These findings were personally discussed with the patient.  Please see medical record for radiology report.  ____________________________________________   PROCEDURES  Procedure(s) performed:  Procedures    Critical Care performed: no ____________________________________________   INITIAL IMPRESSION / ASSESSMENT AND PLAN / ED COURSE  Pertinent labs & imaging results that were available during my care of the patient were reviewed by me and considered in my medical decision making (see chart for details).   DDX: dehydration, dysrhythmia, ACS, TIA, dehydration, orthostasis, panic attack, anemia  EVALIN SHAWHAN is a 57 y.o. who presents to the ED with symptoms as described above.  Blood work was sent for the above differential.  Blood work is reassuring.  Does not seem clinically consistent with ACS.  She has no evidence of acute ischemia on EKG.  No evidence of preexcitation syndrome.  Patient does endorse recent probable foodborne illness over the weekend while she was traveling with her husband.  States that she has had some decreased oral intake.  Possible component of dehydration.  She is not hypoglycemic.  Neuro exam is nonfocal.  No indication for emergent neuroimaging at this time.  Patient tolerating oral hydration and requesting discharge home.  Based on her lack of symptoms at this point I do believe that is an appropriate option for her.  Discussed signs and symptoms for which she should return immediately to hospital.  Have discussed with the patient and available family all diagnostics and treatments performed thus far and all questions were answered to the best of my ability. The patient demonstrates understanding and agreement with plan.       As part of my medical decision making, I reviewed the following data within the [place] notes reviewed and incorporated, Labs reviewed, notes from prior ED  visits.   ____________________________________________   FINAL CLINICAL IMPRESSION(S) / ED DIAGNOSES  Final diagnoses:  Near syncope      NEW MEDICATIONS STARTED DURING THIS VISIT:  New Prescriptions   No medications on file     Note:  This document was prepared using Dragon voice recognition software and may include unintentional dictation errors.    Judy Lot, MD 08/21/17 [phone]

## 2017-08-21 NOTE — ED Notes (Signed)
AAOx3.  Skin warm and dry.  NAD 

## 2017-08-21 NOTE — ED Triage Notes (Addendum)
Pt states she had sudden onset dizziness while driving and felt like she was going to pass out. States she pulled over and went to [facility] who sent the pt here for an abnormal ECG. Pt states she still feels dizzy. Denies any chest pain or SOB. States they did CBG110. States she has been stressed out at work lately and may have had a panic attack

## 2017-12-01 ENCOUNTER — Encounter: Payer: Self-pay | Admitting: Nurse Practitioner

## 2017-12-01 ENCOUNTER — Ambulatory Visit: Payer: 59 | Admitting: Nurse Practitioner

## 2017-12-01 VITALS — BP 138/88 | HR 90 | Temp 98.2°F | Resp 16 | Ht 62.0 in | Wt 142.5 lb

## 2017-12-01 DIAGNOSIS — M8588 Other specified disorders of bone density and structure, other site: Secondary | ICD-10-CM

## 2017-12-01 DIAGNOSIS — H6981 Other specified disorders of Eustachian tube, right ear: Secondary | ICD-10-CM | POA: Diagnosis not present

## 2017-12-01 NOTE — Progress Notes (Signed)
Name: Judy Butler   MRN: [id number]    DOB: [birth year]   Date:12/01/2017                                                              Progress Note  Subjective  Chief Complaint      Chief Complaint  Patient presents with  . Ear Problem    ears clogged up    HPI  Patient endorses right ear feeling clogged and full and clicking noise; seen by ENT last year only diagnosis was mild hearing loss. States when she moves head hears swishing sound. States over the last 6 months has gotten progressively worse. Sometimes feels woozy with it.  No sinus pain or congestion, tinnitus.       Patient Active Problem List   Diagnosis Date Noted  . Diverticulitis of colon without hemorrhage 11/08/2014  . Primary osteoarthritis involving multiple joints 11/08/2014  . Blood pressure elevated without history of HTN 11/08/2014  . Lumbosacral injury 11/08/2014  . Bilateral hip pain 11/08/2014  . Palpitations 03/29/2013  . Asymptomatic varicose veins 09/06/2009  . Migraine without aura 07/13/2008  . Osteopenia 04/14/2007        Past Medical History:  Diagnosis Date  . Asymptomatic varicose veins   . Bronchitis   . Bunion of right foot   . Complication of anesthesia    stopped breathing in recovery room from morphine  . Diverticulosis of colon (without mention of hemorrhage) 2010  . Headache(784.0)   . Osteoarthrosis, unspecified whether generalized or localized, unspecified site    hands  . Osteoporosis, unspecified   . Palpitations   . Post-menopausal   . Post-nasal drainage   . Routine general medical examination at a health care facility          Past Surgical History:  Procedure Laterality Date  . BUNIONECTOMY    . COLON SURGERY  2010   sigmoid resection/rectopexy  . COLONOSCOPY  06/07/14,08/08/08,05/02/03,11/25/94  . DILATION AND CURETTAGE OF UTERUS  10/15   diagnostic/therapeutic  . ESOPHAGOGASTRODUODENOSCOPY  05/02/2003  . METATARSAL  OSTEOTOMY Right 01/19/2015   Procedure: METATARSAL OSTEOTOMY DOUBLE 1ST MET & GREAT TOE PROXIMAL PHALANX;  Surgeon: Albertine Patricia, DPM;  Location: [place];  Service: Podiatry;  Laterality: Right;  LMA WITH LOCAL  . WEIL OSTEOTOMY Right 01/19/2015   Procedure: WEIL OSTEOTOMY 2ND TOE;  Surgeon: Albertine Patricia, DPM;  Location: [place];  Service: Podiatry;  Laterality: Right;    Social History        Tobacco Use  . Smoking status: Never Smoker  . Smokeless tobacco: Never Used  Substance Use Topics  . Alcohol use: Yes    Alcohol/week: 0.0 standard drinks    Comment: Drinks wine socially./2-3 drinks per month     Current Outpatient Medications:  .  calcium-vitamin D 250-100 MG-UNIT tablet, Take 1 tablet by mouth 2 (two) times daily., Disp: 60 tablet, Rfl: 0       Allergies  Allergen Reactions  . Codeine     Hives Vomiting   . Hydrocodone-Acetaminophen Nausea Only and Nausea And Vomiting  . Oxycodone Nausea Only and Nausea And Vomiting  . Oxycodone Hcl Nausea Only and Nausea And Vomiting  . Propoxyphene Nausea Only and Nausea And Vomiting  . Propoxyphene Nausea  Only and Nausea And Vomiting    ROS   No other specific complaints in a complete review of systems (except as listed in HPI above).  Objective     Vitals:   12/01/17 1606  BP: 138/88  Pulse: 90  Resp: 16  Temp: 98.2 F (36.8 C)  TempSrc: Oral  SpO2: 96%  Weight: 142 lb 8 oz (64.6 kg)  Height: _0  (1.575 m)    Body mass index is 26.06 kg/m.  Nursing Note and Vital Signs reviewed.  Physical Exam   Constitutional: Patient appears well-developed and well-nourished.  No distress.  HEENT: head atraumatic, normocephalic, pupils equal and reactive to light, leftTM intact no bulging, erythema; right TM- no erythema, noted effusion. No lymphadenopathy, neck supple, throat within normal limits Cardiovascular: Normal rate Pulmonary/Chest: Effort normal. No  respiratory distress. Psychiatric: Patient has a normal mood and affect. behavior is normal. Judgment and thought content normal.  Assessment and Plan 1. Eustachian tube dysfunction, right Daily anti-histamine, discussed referral to ENT; will call if needed.   2. Osteopenia of spine Continue weight bearing exercises and calcium; vitamin D supplementaiton  - DG Bone Density; Future

## 2017-12-01 NOTE — Patient Instructions (Addendum)
- try taking daily antihistamine for month. Can also use flonase; or nasal saline to help irrigate to allow fluid to release - If unimproved let us know and we can refer to ENT for further work-up   Eustachian Tube Dysfunction The eustachian tube connects the middle ear to the back of the nose. It regulates air pressure in the middle ear by allowing air to move between the ear and nose. It also helps to drain fluid from the middle ear space. When the eustachian tube does not function properly, air pressure, fluid, or both can build up in the middle ear. Eustachian tube dysfunction can affect one or both ears. What are the causes? This condition happens when the eustachian tube becomes blocked or cannot open normally. This may result from:  Ear infections.  Colds and other upper respiratory infections.  Allergies.  Irritation, such as from cigarette smoke or acid from the stomach coming up into the esophagus (gastroesophageal reflux).  Sudden changes in air pressure, such as from descending in an airplane.  Abnormal growths in the nose or throat, such as nasal polyps, tumors, or enlarged tissue at the back of the throat (adenoids).  What increases the risk? This condition may be more likely to develop in people who smoke and people who are overweight. Eustachian tube dysfunction may also be more likely to develop in children, especially children who have:  Certain birth defects of the mouth, such as cleft palate.  Large tonsils and adenoids.  What are the signs or symptoms? Symptoms of this condition may include:  A feeling of fullness in the ear.  Ear pain.  Clicking or popping noises in the ear.  Ringing in the ear.  Hearing loss.  Loss of balance.  Symptoms may get worse when the air pressure around you changes, such as when you travel to an area of high elevation or fly on an airplane. How is this diagnosed? This condition may be diagnosed based on:  Your  symptoms.  A physical exam of your ear, nose, and throat.  Tests, such as those that measure: ? The movement of your eardrum (tympanogram). ? Your hearing (audiometry).  How is this treated? Treatment depends on the cause and severity of your condition. If your symptoms are mild, you may be able to relieve your symptoms by moving air into ("popping") your ears. If you have symptoms of fluid in your ears, treatment may include:  Decongestants.  Antihistamines.  Nasal sprays or ear drops that contain medicines that reduce swelling (steroids).  In some cases, you may need to have a procedure to drain the fluid in your eardrum (myringotomy). In this procedure, a small tube is placed in the eardrum to:  Drain the fluid.  Restore the air in the middle ear space.  Follow these instructions at home:  Take over-the-counter and prescription medicines only as told by your health care provider.  Use techniques to help pop your ears as recommended by your health care provider. These may include: ? Chewing gum. ? Yawning. ? Frequent, forceful swallowing. ? Closing your mouth, holding your nose closed, and gently blowing as if you are trying to blow air out of your nose.  Do not do any of the following until your health care provider approves: ? Travel to high altitudes. ? Fly in airplanes. ? Work in a Estate agent or room. ? Scuba dive.  Keep your ears dry. Dry your ears completely after showering or bathing.  Do not smoke.  Keep all follow-up visits as told by your health care provider. This is important. Contact a health care provider if:  Your symptoms do not go away after treatment.  Your symptoms come back after treatment.  You are unable to pop your ears.  You have: ? A fever. ? Pain in your ear. ? Pain in your head or neck. ? Fluid draining from your ear.  Your hearing suddenly changes.  You become very dizzy.  You lose your balance. This information is  not intended to replace advice given to you by your health care provider. Make sure you discuss any questions you have with your health care provider. Document Released: 03/17/2015 Document Revised: 07/27/2015 Document Reviewed: 03/09/2014 Elsevier Interactive Patient Education  [employer].

## 2017-12-01 NOTE — Progress Notes (Signed)
Name: Judy Butler   MRN: [id number]    DOB: 12/09/1960   Date:12/01/2017       Progress Note  Subjective  Chief Complaint  Chief Complaint  Patient presents with  . Ear Problem    ears clogged up    HPI  Patient endorses right ear feeling clogged and full and clicking noise; seen by ENT last year only diagnosis was mild hearing loss. States when she moves head hears swishing sound. States over the last 6 months has gotten progressively worse. Sometimes feels woozy with it.  No sinus pain or congestion, tinnitus.  Patient Active Problem List   Diagnosis Date Noted  . Diverticulitis of colon without hemorrhage 11/08/2014  . Primary osteoarthritis involving multiple joints 11/08/2014  . Blood pressure elevated without history of HTN 11/08/2014  . Lumbosacral injury 11/08/2014  . Bilateral hip pain 11/08/2014  . Palpitations 03/29/2013  . Asymptomatic varicose veins 09/06/2009  . Migraine without aura 07/13/2008  . Osteopenia 04/14/2007    Past Medical History:  Diagnosis Date  . Asymptomatic varicose veins   . Bronchitis   . Bunion of right foot   . Complication of anesthesia    stopped breathing in recovery room from morphine  . Diverticulosis of colon (without mention of hemorrhage) 2010  . Headache(784.0)   . Osteoarthrosis, unspecified whether generalized or localized, unspecified site    hands  . Osteoporosis, unspecified   . Palpitations   . Post-menopausal   . Post-nasal drainage   . Routine general medical examination at a health care facility     Past Surgical History:  Procedure Laterality Date  . BUNIONECTOMY    . COLON SURGERY  2010   sigmoid resection/rectopexy  . COLONOSCOPY  06/07/14,08/08/08,05/02/03,11/25/94  . DILATION AND CURETTAGE OF UTERUS  10/15   diagnostic/therapeutic  . ESOPHAGOGASTRODUODENOSCOPY  05/02/2003  . METATARSAL OSTEOTOMY Right 01/19/2015   Procedure: METATARSAL OSTEOTOMY DOUBLE 1ST MET & GREAT TOE PROXIMAL PHALANX;  Surgeon:  Albertine Patricia, DPM;  Location: [place];  Service: Podiatry;  Laterality: Right;  LMA WITH LOCAL  . WEIL OSTEOTOMY Right 01/19/2015   Procedure: WEIL OSTEOTOMY 2ND TOE;  Surgeon: Albertine Patricia, DPM;  Location: [place];  Service: Podiatry;  Laterality: Right;    Social History   Tobacco Use  . Smoking status: Never Smoker  . Smokeless tobacco: Never Used  Substance Use Topics  . Alcohol use: Yes    Alcohol/week: 0.0 standard drinks    Comment: Drinks wine socially./2-3 drinks per month     Current Outpatient Medications:  .  calcium-vitamin D 250-100 MG-UNIT tablet, Take 1 tablet by mouth 2 (two) times daily., Disp: 60 tablet, Rfl: 0  Allergies  Allergen Reactions  . Codeine     Hives Vomiting   . Hydrocodone-Acetaminophen Nausea Only and Nausea And Vomiting  . Oxycodone Nausea Only and Nausea And Vomiting  . Oxycodone Hcl Nausea Only and Nausea And Vomiting  . Propoxyphene Nausea Only and Nausea And Vomiting  . Propoxyphene Nausea Only and Nausea And Vomiting    ROS   No other specific complaints in a complete review of systems (except as listed in HPI above).  Objective  Vitals:   12/01/17 1606  BP: 138/88  Pulse: 90  Resp: 16  Temp: 98.2 F (36.8 C)  TempSrc: Oral  SpO2: 96%  Weight: 142 lb 8 oz (64.6 kg)  Height: 5' 2"  (1.575 m)     Body mass index is 26.06 kg/m.  Nursing Note  and Vital Signs reviewed.  Physical Exam  Constitutional: Patient appears well-developed and well-nourished.  No distress.  HEENT: head atraumatic, normocephalic, pupils equal and reactive to light, leftTM intact no bulging, erythema; right TM- no erythema, noted effusion. No lymphadenopathy, neck supple, throat within normal limits Cardiovascular: Normal rate Pulmonary/Chest: Effort normal. No respiratory distress. Psychiatric: Patient has a normal mood and affect. behavior is normal. Judgment and thought content normal.   No results found for  this or any previous visit (from the past 48 hour(s)).  Assessment & Plan  1. Eustachian tube dysfunction, right Daily anti-histamine, discussed referral to ENT; will call if needed.   2. Osteopenia of spine Continue weight bearing exercises and calcium; vitamin D supplementaiton  - DG Bone Density; Future

## 2017-12-01 NOTE — Progress Notes (Deleted)
Name: Judy Butler   MRN: [id number]    DOB: February 24, 1961   Date:12/01/2017       Progress Note  Subjective  Chief Complaint  Chief Complaint  Patient presents with  . Ear Problem    ears clogged up    HPI  ***  Patient Active Problem List   Diagnosis Date Noted  . Diverticulitis of colon without hemorrhage 11/08/2014  . Primary osteoarthritis involving multiple joints 11/08/2014  . Blood pressure elevated without history of HTN 11/08/2014  . Lumbosacral injury 11/08/2014  . Bilateral hip pain 11/08/2014  . Palpitations 03/29/2013  . Asymptomatic varicose veins 09/06/2009  . Migraine without aura 07/13/2008  . Osteopenia 04/14/2007    Past Medical History:  Diagnosis Date  . Asymptomatic varicose veins   . Bronchitis   . Bunion of right foot   . Complication of anesthesia    stopped breathing in recovery room from morphine  . Diverticulosis of colon (without mention of hemorrhage) 2010  . Headache(784.0)   . Osteoarthrosis, unspecified whether generalized or localized, unspecified site    hands  . Osteoporosis, unspecified   . Palpitations   . Post-menopausal   . Post-nasal drainage   . Routine general medical examination at a health care facility     Past Surgical History:  Procedure Laterality Date  . BUNIONECTOMY    . COLON SURGERY  2010   sigmoid resection/rectopexy  . COLONOSCOPY  06/07/14,08/08/08,05/02/03,11/25/94  . DILATION AND CURETTAGE OF UTERUS  10/15   diagnostic/therapeutic  . ESOPHAGOGASTRODUODENOSCOPY  05/02/2003  . METATARSAL OSTEOTOMY Right 01/19/2015   Procedure: METATARSAL OSTEOTOMY DOUBLE 1ST MET & GREAT TOE PROXIMAL PHALANX;  Surgeon: Albertine Patricia, DPM;  Location: [facility];  Service: Podiatry;  Laterality: Right;  LMA WITH LOCAL  . WEIL OSTEOTOMY Right 01/19/2015   Procedure: WEIL OSTEOTOMY 2ND TOE;  Surgeon: Albertine Patricia, DPM;  Location: [facility];  Service: Podiatry;  Laterality: Right;    Social History    Tobacco Use  . Smoking status: Never Smoker  . Smokeless tobacco: Never Used  Substance Use Topics  . Alcohol use: Yes    Alcohol/week: 0.0 standard drinks    Comment: Drinks wine socially./2-3 drinks per month     Current Outpatient Medications:  .  calcium-vitamin D 250-100 MG-UNIT tablet, Take 1 tablet by mouth 2 (two) times daily., Disp: 60 tablet, Rfl: 0  Allergies  Allergen Reactions  . Codeine     Hives Vomiting   . Hydrocodone-Acetaminophen Nausea Only and Nausea And Vomiting  . Oxycodone Nausea Only and Nausea And Vomiting  . Oxycodone Hcl Nausea Only and Nausea And Vomiting  . Propoxyphene Nausea Only and Nausea And Vomiting  . Propoxyphene Nausea Only and Nausea And Vomiting    ROS  ***  No other specific complaints in a complete review of systems (except as listed in HPI above).  Objective  Vitals:   12/01/17 1606  BP: 138/88  Pulse: 90  Resp: 16  Temp: 98.2 F (36.8 C)  TempSrc: Oral  SpO2: 96%  Weight: 142 lb 8 oz (64.6 kg)  Height: 5' 2" (1.575 m)   ***  Body mass index is 26.06 kg/m.  Nursing Note and Vital Signs reviewed.  Physical Exam  ***   No results found for this or any previous visit (from the past 48 hour(s)).  Assessment & Plan  There are no diagnoses linked to this encounter.   -Red flags and when to present for emergency care or  RTC including fever >101.58F, chest pain, shortness of breath, new/worsening/un-resolving symptoms, *** reviewed with patient at time of visit. Follow up and care instructions discussed and provided in AVS. -Reviewed Health Maintenance: ***

## 2018-03-09 ENCOUNTER — Other Ambulatory Visit: Payer: Self-pay | Admitting: Obstetrics and Gynecology

## 2018-03-09 DIAGNOSIS — Z1231 Encounter for screening mammogram for malignant neoplasm of breast: Secondary | ICD-10-CM

## 2018-03-10 ENCOUNTER — Other Ambulatory Visit: Payer: Self-pay | Admitting: Podiatry

## 2018-03-12 ENCOUNTER — Encounter: Payer: Self-pay | Admitting: Anesthesiology

## 2018-03-19 ENCOUNTER — Ambulatory Visit: Payer: Managed Care, Other (non HMO) | Admitting: Anesthesiology

## 2018-03-19 ENCOUNTER — Encounter
Admission: RE | Disposition: A | Discharge status: Home / Self Care | Payer: Self-pay | Source: Home / Self Care | Attending: Podiatry

## 2018-03-19 ENCOUNTER — Ambulatory Visit
Admission: RE | Admit: 2018-03-19 | Discharge: 2018-03-19 | Disposition: A | Discharge status: Home / Self Care | Payer: Managed Care, Other (non HMO) | Attending: Podiatry | Admitting: Podiatry

## 2018-03-19 DIAGNOSIS — M2012 Hallux valgus (acquired), left foot: Secondary | ICD-10-CM | POA: Diagnosis present

## 2018-03-19 DIAGNOSIS — Q66212 Congenital metatarsus primus varus, left foot: Secondary | ICD-10-CM | POA: Insufficient documentation

## 2018-03-19 HISTORY — PX: AIKEN OSTEOTOMY: SHX6331

## 2018-03-19 HISTORY — DX: Other specified postprocedural states: Z98.890

## 2018-03-19 HISTORY — DX: Other specified postprocedural states: R11.2

## 2018-03-19 SURGERY — BUNIONECTOMY, AKIN
Anesthesia: General | Site: Foot | Laterality: Left

## 2018-03-19 MED ORDER — FENTANYL CITRATE (PF) 100 MCG/2ML IJ SOLN
INTRAMUSCULAR | Status: DC | PRN
  Administered 2018-03-19: 25 ug via INTRAVENOUS

## 2018-03-19 MED ORDER — ACETAMINOPHEN 325 MG PO TABS: 325.0000 mg | ORAL_TABLET | ORAL | Status: DC | PRN

## 2018-03-19 MED ORDER — CEFAZOLIN SODIUM-DEXTROSE 2-4 GM/100ML-% IV SOLN
2.0000 g | INTRAVENOUS | Status: AC
  Administered 2018-03-19: 2 g via INTRAVENOUS

## 2018-03-19 MED ORDER — MIDAZOLAM HCL 5 MG/5ML IJ SOLN
INTRAMUSCULAR | Status: DC | PRN
  Administered 2018-03-19 (×2): 1 mg via INTRAVENOUS

## 2018-03-19 MED ORDER — OXYCODONE HCL 5 MG PO TABS: 5.0000 mg | ORAL_TABLET | Freq: Once | ORAL | Status: DC | PRN

## 2018-03-19 MED ORDER — BUPIVACAINE HCL (PF) 0.25 % IJ SOLN
INTRAMUSCULAR | Status: DC | PRN
  Administered 2018-03-19: 4.5 mL

## 2018-03-19 MED ORDER — LIDOCAINE HCL (CARDIAC) PF 100 MG/5ML IV SOSY
PREFILLED_SYRINGE | INTRAVENOUS | Status: DC | PRN
  Administered 2018-03-19: 30 mg via INTRATRACHEAL

## 2018-03-19 MED ORDER — ONDANSETRON HCL 4 MG/2ML IJ SOLN
INTRAMUSCULAR | Status: DC | PRN
  Administered 2018-03-19: 4 mg via INTRAVENOUS

## 2018-03-19 MED ORDER — SCOPOLAMINE 1 MG/3DAYS TD PT72
1.0000 | MEDICATED_PATCH | Freq: Once | TRANSDERMAL | Status: DC
  Administered 2018-03-19: 1.5 mg via TRANSDERMAL

## 2018-03-19 MED ORDER — GLYCOPYRROLATE 0.2 MG/ML IJ SOLN
INTRAMUSCULAR | Status: DC | PRN
  Administered 2018-03-19: 0.1 mg via INTRAVENOUS

## 2018-03-19 MED ORDER — HYDROCODONE-ACETAMINOPHEN 5-325 MG PO TABS: 1.0000 | ORAL_TABLET | Freq: Four times a day (QID) | ORAL | 0 refills | Status: DC | PRN

## 2018-03-19 MED ORDER — LACTATED RINGERS IV SOLN
10.0000 mL/h | INTRAVENOUS | Status: DC
  Administered 2018-03-19: 10 mL/h via INTRAVENOUS

## 2018-03-19 MED ORDER — FENTANYL CITRATE (PF) 100 MCG/2ML IJ SOLN: 25.0000 ug | INTRAMUSCULAR | Status: DC | PRN

## 2018-03-19 MED ORDER — PHENYLEPHRINE HCL 10 MG/ML IJ SOLN
INTRAMUSCULAR | Status: DC | PRN
  Administered 2018-03-19: 50 ug via INTRAVENOUS
  Administered 2018-03-19: 100 ug via INTRAVENOUS
  Administered 2018-03-19 (×2): 50 ug via INTRAVENOUS
  Administered 2018-03-19: 10 ug via INTRAVENOUS

## 2018-03-19 MED ORDER — PROMETHAZINE HCL 25 MG/ML IJ SOLN: 6.2500 mg | INTRAMUSCULAR | Status: DC | PRN

## 2018-03-19 MED ORDER — ACETAMINOPHEN 160 MG/5ML PO SOLN: 325.0000 mg | ORAL | Status: DC | PRN

## 2018-03-19 MED ORDER — POVIDONE-IODINE 7.5 % EX SOLN
Freq: Once | CUTANEOUS | Status: AC
  Administered 2018-03-19: 07:00:00 via TOPICAL

## 2018-03-19 MED ORDER — PROPOFOL 10 MG/ML IV BOLUS
INTRAVENOUS | Status: DC | PRN
  Administered 2018-03-19: 200 mg via INTRAVENOUS

## 2018-03-19 MED ORDER — OXYCODONE HCL 5 MG/5ML PO SOLN: 5.0000 mg | Freq: Once | ORAL | Status: DC | PRN

## 2018-03-19 MED ORDER — PROMETHAZINE HCL 12.5 MG PO TABS: 12.5000 mg | ORAL_TABLET | Freq: Four times a day (QID) | ORAL | 0 refills | Status: DC | PRN

## 2018-03-19 MED ORDER — BUPIVACAINE LIPOSOME 1.3 % IJ SUSP
INTRAMUSCULAR | Status: DC | PRN
  Administered 2018-03-19: 10 mL
  Administered 2018-03-19: 4.5 mL

## 2018-03-19 MED ORDER — DEXAMETHASONE SODIUM PHOSPHATE 4 MG/ML IJ SOLN
INTRAMUSCULAR | Status: DC | PRN
  Administered 2018-03-19: 4 mg via INTRAVENOUS

## 2018-03-19 SURGICAL SUPPLY — 54 items
1.7 drill bit ×2 IMPLANT
2.5 countersink ×2 IMPLANT
BANDAGE ELASTIC 4 LF NS (GAUZE/BANDAGES/DRESSINGS) ×3 IMPLANT
BENZOIN TINCTURE PRP APPL 2/3 (GAUZE/BANDAGES/DRESSINGS) ×3 IMPLANT
BLADE MED AGGRESSIVE (BLADE) ×2 IMPLANT
BLADE MINI RND TIP GREEN BEAV (BLADE) IMPLANT
BLADE OSC/SAGITTAL MD 5.5X18 (BLADE) ×2 IMPLANT
BNDG ESMARK 4X12 TAN STRL LF (GAUZE/BANDAGES/DRESSINGS) ×3 IMPLANT
BNDG GAUZE 4.5X4.1 6PLY STRL (MISCELLANEOUS) ×3 IMPLANT
BNDG STRETCH 4X75 STRL LF (GAUZE/BANDAGES/DRESSINGS) ×3 IMPLANT
CANISTER SUCT 1200ML W/VALVE (MISCELLANEOUS) ×3 IMPLANT
CLOSURE WOUND 1/4X4 (GAUZE/BANDAGES/DRESSINGS) ×1
COVER LIGHT HANDLE UNIVERSAL (MISCELLANEOUS) ×6 IMPLANT
COVER PIN YLW 0.028-062 (MISCELLANEOUS) IMPLANT
CUFF TOURN SGL QUICK 18 (TOURNIQUET CUFF) ×2 IMPLANT
DRAPE FLUOR MINI C-ARM 54X84 (DRAPES) ×3 IMPLANT
DRILL WIRE PASS (DRILL) IMPLANT
DURAPREP 26ML APPLICATOR (WOUND CARE) ×3 IMPLANT
ELECT REM PT RETURN 9FT ADLT (ELECTROSURGICAL) ×3
ELECTRODE REM PT RTRN 9FT ADLT (ELECTROSURGICAL) ×1 IMPLANT
GAUZE PETRO XEROFOAM 1X8 (MISCELLANEOUS) ×3 IMPLANT
GAUZE SPONGE 4X4 12PLY STRL (GAUZE/BANDAGES/DRESSINGS) ×3 IMPLANT
GLOVE BIO SURGEON STRL SZ8 (GLOVE) ×3 IMPLANT
GOWN STRL REUS W/ TWL LRG LVL3 (GOWN DISPOSABLE) ×1 IMPLANT
GOWN STRL REUS W/ TWL XL LVL3 (GOWN DISPOSABLE) ×1 IMPLANT
GOWN STRL REUS W/TWL LRG LVL3 (GOWN DISPOSABLE) ×2
GOWN STRL REUS W/TWL XL LVL3 (GOWN DISPOSABLE) ×2
K-WIRE DBL END TROCAR 6X.045 (WIRE) ×3
KIT PROCEDURE DRILL (DRILL) ×2 IMPLANT
KIT TURNOVER KIT A (KITS) ×3 IMPLANT
KWIRE DBL END TROCAR 6X.045 (WIRE) IMPLANT
NDL HYPO 18GX1.5 BLUNT FILL (NEEDLE) IMPLANT
NDL HYPO 25GX1X1/2 BEV (NEEDLE) IMPLANT
NEEDLE HYPO 18GX1.5 BLUNT FILL (NEEDLE) ×3 IMPLANT
NEEDLE HYPO 25GX1X1/2 BEV (NEEDLE) ×3 IMPLANT
NS IRRIG 500ML POUR BTL (IV SOLUTION) ×3 IMPLANT
PACK EXTREMITY ARMC (MISCELLANEOUS) ×3 IMPLANT
PADDING CAST BLEND 3X4 NS (MISCELLANEOUS) IMPLANT
PADDING CAST BLEND 3X4YD NS (MISCELLANEOUS) ×4
PENCIL SMOKE EVACUATOR (MISCELLANEOUS) ×3 IMPLANT
RASP SM TEAR CROSS CUT (RASP) ×3 IMPLANT
SPLINT CAST 1 STEP 4X30 (MISCELLANEOUS) ×2 IMPLANT
SPLINT FAST PLASTER 5X30 (CAST SUPPLIES) ×2
SPLINT PLASTER CAST FAST 5X30 (CAST SUPPLIES) IMPLANT
STOCKINETTE STRL 6IN 960660 (GAUZE/BANDAGES/DRESSINGS) ×3 IMPLANT
STRAP BODY AND KNEE 60X3 (MISCELLANEOUS) ×3 IMPLANT
STRIP CLOSURE SKIN 1/4X4 (GAUZE/BANDAGES/DRESSINGS) ×2 IMPLANT
SUT VIC AB 3-0 SH 27 (SUTURE) ×2
SUT VIC AB 3-0 SH 27X BRD (SUTURE) IMPLANT
SUT VIC AB 4-0 FS2 27 (SUTURE) ×2 IMPLANT
SYR 10ML LL (SYRINGE) ×2 IMPLANT
WIRE SMOOTH TROCAR .9MMX150MML (WIRE) ×2 IMPLANT
dynaclip bone fixation system (Staple) ×2 IMPLANT
paragon 28 mini monster 30 mm headless screw (Screw) ×2 IMPLANT

## 2018-03-19 NOTE — Anesthesia Procedure Notes (Signed)
Procedure Name: LMA Insertion Date/Time: 03/19/2018 7:36 AM Performed by: Maree Krabbe, CRNA Pre-anesthesia Checklist: Patient identified, Emergency Drugs available, Suction available, Timeout performed and Patient being monitored Patient Re-evaluated:Patient Re-evaluated prior to induction Oxygen Delivery Method: Circle system utilized Preoxygenation: Pre-oxygenation with 100% oxygen Induction Type: IV induction LMA: LMA inserted LMA Size: 4.0 Number of attempts: 1 Placement Confirmation: positive ETCO2 and breath sounds checked- equal and bilateral Tube secured with: Tape Dental Injury: Teeth and Oropharynx as per pre-operative assessment

## 2018-03-19 NOTE — H&P (Signed)
H and P has been reviewed and no changes are noted.  

## 2018-03-19 NOTE — Discharge Instructions (Signed)
[facility] °[facility] ° °POST OPERATIVE INSTRUCTIONS FOR DR. TROXLER AND DR. FOWLER °[facility] ° ° °1. Take your medication as prescribed.  Pain medication should be taken only as needed. ° °2. Keep the dressing clean, dry and intact. ° °3. Keep your foot elevated above the heart level for the first 48 hours. ° °4. Walking to the bathroom and brief periods of walking are acceptable, unless we have instructed you to be non-weight bearing. ° °5. Always wear your post-op shoe when walking.  Always use your crutches if you are to be non-weight bearing. ° °6. Do not take a shower. Baths are permissible as long as the foot is kept out of the water.  ° °7. Every hour you are awake:  °- Bend your knee 15 times. °- Flex foot 15 times °- Massage calf 15 times ° °8. Call [facility] ([phone]) if any of the following problems occur: °- You develop a temperature or fever. °- The bandage becomes saturated with blood. °- Medication does not stop your pain. °- Injury of the foot occurs. °- Any symptoms of infection including redness, odor, or red streaks running from wound. ° °Scopolamine skin patches °REMOVE PATCH IN 72 HOURS AND WASH HANDS IMMEDIATELY °What is this medicine? °SCOPOLAMINE (skoe POL a meen) is used to prevent nausea and vomiting caused by motion sickness, anesthesia and surgery. °This medicine may be used for other purposes; ask your health care provider or pharmacist if you have questions. °COMMON BRAND NAME(S): Transderm Scop °What should I tell my health care provider before I take this medicine? °They need to know if you have any of these conditions: °-are scheduled to have a gastric secretion test °-glaucoma °-heart disease °-kidney disease °-liver disease °-lung or breathing disease, like asthma °-mental illness °-prostate disease °-seizures °-stomach or intestine problems °-trouble passing urine °-an unusual or allergic reaction to  scopolamine, atropine, other medicines, foods, dyes, or preservatives °-pregnant or trying to get pregnant °-breast-feeding °How should I use this medicine? °This medicine is for external use only. Follow the directions on the prescription label. Wear only 1 patch at a time. Choose an area behind the ear, that is clean, dry, hairless and free from any cuts or irritation. Wipe the area with a clean dry tissue. Peel off the plastic backing of the skin patch, trying not to touch the adhesive side with your hands. Do not cut the patches. Firmly apply to the area you have chosen, with the metallic side of the patch to the skin and the tan-colored side showing. Once firmly in place, wash your hands well with soap and water. Do not get this medicine into your eyes. °After removing the patch, wash your hands and the area behind your ear thoroughly with soap and water. The patch will still contain some medicine after use. To avoid accidental contact or ingestion by children or pets, fold the used patch in half with the sticky side together and throw away in the trash out of the reach of children and pets. If you need to use a second patch after you remove the first, place it behind the other ear. °A special MedGuide will be given to you by the pharmacist with each prescription and refill. Be sure to read this information carefully each time. °Talk to your pediatrician regarding the use of this medicine in children. Special care may be needed. °Overdosage: If you think you have taken too much of this medicine contact   a poison control center or emergency room at once. °NOTE: This medicine is only for you. Do not share this medicine with others. °What if I miss a dose? °This does not apply. This medicine is not for regular use. °What may interact with this medicine? °-alcohol °-antihistamines for allergy cough and cold °-atropine °-certain medicines for anxiety or sleep °-certain medicines for bladder problems like oxybutynin,  tolterodine °-certain medicines for depression like amitriptyline, fluoxetine, sertraline °-certain medicines for stomach problems like dicyclomine, hyoscyamine °-certain medicines for Parkinson's disease like benztropine, trihexyphenidyl °-certain medicines for seizures like phenobarbital, primidone °-general anesthetics like halothane, isoflurane, methoxyflurane, propofol °-ipratropium °-local anesthetics like lidocaine, pramoxine, tetracaine °-medicines that relax muscles for surgery °-phenothiazines like chlorpromazine, mesoridazine, prochlorperazine, thioridazine °-narcotic medicines for pain °-other belladonna alkaloids °This list may not describe all possible interactions. Give your health care provider a list of all the medicines, herbs, non-prescription drugs, or dietary supplements you use. Also tell them if you smoke, drink alcohol, or use illegal drugs. Some items may interact with your medicine. °What should I watch for while using this medicine? °Limit contact with water while swimming and bathing because the patch may fall off. If the patch falls off, throw it away and put a new one behind the other ear. °You may get drowsy or dizzy. Do not drive, use machinery, or do anything that needs mental alertness until you know how this medicine affects you. Do not stand or sit up quickly, especially if you are an older patient. This reduces the risk of dizzy or fainting spells. Alcohol may interfere with the effect of this medicine. Avoid alcoholic drinks. °Your mouth may get dry. Chewing sugarless gum or sucking hard candy, and drinking plenty of water may help. Contact your healthcare professional if the problem does not go away or is severe. °This medicine may cause dry eyes and blurred vision. If you wear contact lenses, you may feel some discomfort. Lubricating drops may help. See your healthcare professional if the problem does not go away or is severe. °If you are going to need surgery, an MRI, CT  scan, or other procedure, tell your healthcare professional that you are using this medicine. You may need to remove the patch before the procedure. °What side effects may I notice from receiving this medicine? °Side effects that you should report to your doctor or health care professional as soon as possible: °-allergic reactions like skin rash, itching or hives; swelling of the face, lips, or tongue °-blurred vision °-changes in vision °-confusion °-dizziness °-eye pain °-fast, irregular heartbeat °-hallucinations, loss of contact with reality °-nausea, vomiting °-pain or trouble passing urine °-restlessness °-seizures °-skin irritation °-stomach pain °Side effects that usually do not require medical attention (report to your doctor or health care professional if they continue or are bothersome): °-drowsiness °-dry mouth °-headache °-sore throat °This list may not describe all possible side effects. Call your doctor for medical advice about side effects. You may report side effects to FDA at 1-800-FDA-1088. °Where should I keep my medicine? °Keep out of the reach of children. °Store at room temperature between 20 and 25 degrees C (68 and 77 degrees F). Keep this medicine in the foil package until ready to use. Throw away any unused medicine after the expiration date. °NOTE: This sheet is a summary. It may not cover all possible information. If you have questions about this medicine, talk to your doctor, pharmacist, or health care provider. °© 2019 Elsevier/Gold Standard (2017-05-09 16:14:46) ° ° °General   Anesthesia, Adult, Care After °This sheet gives you information about how to care for yourself after your procedure. Your health care provider may also give you more specific instructions. If you have problems or questions, contact your health care provider. °What can I expect after the procedure? °After the procedure, the following side effects are common: °· Pain or discomfort at the IV  site. °· Nausea. °· Vomiting. °· Sore throat. °· Trouble concentrating. °· Feeling cold or chills. °· Weak or tired. °· Sleepiness and fatigue. °· Soreness and body aches. These side effects can affect parts of the body that were not involved in surgery. °Follow these instructions at home: ° °For at least 24 hours after the procedure: °· Have a responsible adult stay with you. It is important to have someone help care for you until you are awake and alert. °· Rest as needed. °· Do not: °? Participate in activities in which you could fall or become injured. °? Drive. °? Use heavy machinery. °? Drink alcohol. °? Take sleeping pills or medicines that cause drowsiness. °? Make important decisions or sign legal documents. °? Take care of children on your own. °Eating and drinking °· Follow any instructions from your health care provider about eating or drinking restrictions. °· When you feel hungry, start by eating small amounts of foods that are soft and easy to digest (bland), such as toast. Gradually return to your regular diet. °· Drink enough fluid to keep your urine pale yellow. °· If you vomit, rehydrate by drinking water, juice, or clear broth. °General instructions °· If you have sleep apnea, surgery and certain medicines can increase your risk for breathing problems. Follow instructions from your health care provider about wearing your sleep device: °? Anytime you are sleeping, including during daytime naps. °? While taking prescription pain medicines, sleeping medicines, or medicines that make you drowsy. °· Return to your normal activities as told by your health care provider. Ask your health care provider what activities are safe for you. °· Take over-the-counter and prescription medicines only as told by your health care provider. °· If you smoke, do not smoke without supervision. °· Keep all follow-up visits as told by your health care provider. This is important. °Contact a health care provider if: °· You  have nausea or vomiting that does not get better with medicine. °· You cannot eat or drink without vomiting. °· You have pain that does not get better with medicine. °· You are unable to pass urine. °· You develop a skin rash. °· You have a fever. °· You have redness around your IV site that gets worse. °Get help right away if: °· You have difficulty breathing. °· You have chest pain. °· You have blood in your urine or stool, or you vomit blood. °Summary °· After the procedure, it is common to have a sore throat or nausea. It is also common to feel tired. °· Have a responsible adult stay with you for the first 24 hours after general anesthesia. It is important to have someone help care for you until you are awake and alert. °· When you feel hungry, start by eating small amounts of foods that are soft and easy to digest (bland), such as toast. Gradually return to your regular diet. °· Drink enough fluid to keep your urine pale yellow. °· Return to your normal activities as told by your health care provider. Ask your health care provider what activities are safe for you. °This information is not   intended to replace advice given to you by your health care provider. Make sure you discuss any questions you have with your health care provider. °Document Released: 05/27/2000 Document Revised: 10/04/2016 Document Reviewed: 10/04/2016 °Elsevier Interactive Patient Education © 2019 Elsevier Inc. ° °

## 2018-03-19 NOTE — Anesthesia Preprocedure Evaluation (Signed)
Anesthesia Evaluation  Patient identified by MRN, date of birth, ID band Patient awake    Reviewed: Allergy & Precautions, NPO status , Patient's Chart, lab work & pertinent test results  History of Anesthesia Complications (+) PONV  Airway Mallampati: II  TM Distance: >3 FB     Dental   Pulmonary    breath sounds clear to auscultation       Cardiovascular negative cardio ROS   Rhythm:Regular Rate:Normal     Neuro/Psych  Headaches,    GI/Hepatic negative GI ROS,   Endo/Other    Renal/GU      Musculoskeletal  (+) Arthritis ,   Abdominal   Peds  Hematology   Anesthesia Other Findings   Reproductive/Obstetrics                             Anesthesia Physical Anesthesia Plan  ASA: II  Anesthesia Plan: General   Post-op Pain Management:    Induction: Intravenous  PONV Risk Score and Plan: Ondansetron, Dexamethasone and Scopolamine patch - Pre-op  Airway Management Planned: LMA  Additional Equipment:   Intra-op Plan:   Post-operative Plan:   Informed Consent: I have reviewed the patients History and Physical, chart, labs and discussed the procedure including the risks, benefits and alternatives for the proposed anesthesia with the patient or authorized representative who has indicated his/her understanding and acceptance.     Dental advisory given  Plan Discussed with: CRNA  Anesthesia Plan Comments:         Anesthesia Quick Evaluation

## 2018-03-19 NOTE — Anesthesia Postprocedure Evaluation (Signed)
Anesthesia Post Note  Patient: Judy Butler  Procedure(s) Performed: Quintella Reichert OSTEOTOMY-(HALLUX VALGUS-DOUBLE OSTEOTOMY) (Left Foot)  Patient location during evaluation: PACU Anesthesia Type: General Level of consciousness: awake Pain management: pain level controlled Vital Signs Assessment: post-procedure vital signs reviewed and stable Respiratory status: respiratory function stable Cardiovascular status: stable Postop Assessment: no signs of nausea or vomiting Anesthetic complications: no    Jola Babinski

## 2018-03-19 NOTE — Transfer of Care (Signed)
Immediate Anesthesia Transfer of Care Note  Patient: Judy Butler  Procedure(s) Performed: Quintella Reichert OSTEOTOMY-(HALLUX VALGUS-DOUBLE OSTEOTOMY) (Left Foot)  Patient Location: PACU  Anesthesia Type: General  Level of Consciousness: awake, alert  and patient cooperative  Airway and Oxygen Therapy: Patient Spontanous Breathing and Patient connected to supplemental oxygen  Post-op Assessment: Post-op Vital signs reviewed, Patient's Cardiovascular Status Stable, Respiratory Function Stable, Patent Airway and No signs of Nausea or vomiting  Post-op Vital Signs: Reviewed and stable  Complications: No apparent anesthesia complications

## 2018-03-19 NOTE — Op Note (Signed)
Operative note   Surgeon: Dr. Recardo EvangelistMatthew Emelie Butler, DPM.    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  Hallux abductovalgus correction left foot with double osteotomy including modified Austin with screw fixation and Akin osteotomy with K bone staple fixation specifically the Dyanna clip from med shape 8 mm           EBL: Less than 10 cc    Anesthesia:general delivered by the anesthesia team and I delivered Exparel injection both preoperatively and postoperatively.  Preoperatively I injected 10 cc of a 50-50 mixture 0.25% Marcaine plain mixed with the Exparel rel.  Postoperatively I injected 8 cc of plain Exparel at the base of the first metatarsal   Hemostasis: Ankle tourniquet 240 mils mercury pressure for 45 minutes    Specimen: None    Complications: None.  Patient did however have some significant cartilage erosion on the first metatarsal head as I explained we would likely find before the surgery.    Operative indications: Painful hallux abductovalgus deformity left foot resistant to conservative care    Procedure:  Patient was brought into the OR and placed on the operating table in thesupine position. After anesthesia was obtained theleft lower extremity was prepped and draped in usual sterile fashion.  Operative Report: This time attention was directed to the first metatarsal phalangeal joint of the left foot.  A 6 cm linear incision was made in deepened with sharp blunt dissection bleeders clamped and bovied as required.  This point capsule tissue was then divided incised longitudinally reflected away from the dorsal medial plantar aspects of the metatarsal head a large eminence of bone was noted medially and dorsomedially on the metatarsal head.  This was resected and rasped smoothly.  This point also there was cartilage erosion noted in the metatarsal head centrally approximately a  third of the cartilage had significantly eroded.  There is also some on the plantar central aspect.  These regions were micro-drilled with the tip of a 0.045 K wire.  This point the K wire was introduced into the medial aspect of the metatarsal head neck juncture serve as an apical axis guide for make the osteotomy cut.  Because the patient had significant L of Vitas and good length of the metatarsal I opted to do a double cut on the dorsal aspect to allow for plantarflexion of the metatarsal head with the correction.  This portion of bone dorsally was removed following the osteotomy cuts.  At this point attention was directed to the lateral aspect of the joint where an abductor tendon release fibular sesamoidal ligament release and lateral capsulotomy were performed.  At this point the head of the metatarsal was then shifted laterally and plantarly.  Once it was good position it was temporarily fixated with a K wire from the 2.2 screw set.  The area was checked FluoroScan good position and correction were noted.  This point the area was drilled and a 30 mm screw was placed across the area for fixation.  This was then checked FluoroScan good position of the screw was noted and good fixation of the metatarsal was noted.  Next the medial shelf of bone was then resected and rasped smoothly.  This was then copiously irrigated.  Attention was then directed to the proximal phalanx where an osteotomy was performed in a V fashion with apex lateral base medial.  The wedge  was feathered and closed and the area was then fixated with a bone staple.  Good positioning and fixation was noted at this juncture on that region.  Following copious irrigation attention was directed back to the MTP joint where a capsulorrhaphy was performed medially and closed with 3-0 Vicryl simple interrupted sutures.  3-0 Vicryl in a horizontal mattress stitch was used to bring the extensor tendon back over the more central position as well.  After  irrigation once again the periosteal and capsular tissue along the course of the incision was closed the rest of the way with 4-0 Vicryl in a continuous stitch.  FluoroScan was utilized check for good position of the toe which was accomplished.  Deep and superficial fascial layer was then closed with 4-0 Vicryl in a continuous stitch.  Skin was closed with 4-0 Vicryl in a subcuticular stitch.  A sterile compressive dressing was then placed across wound consisting of Steri-Strips Xeroform gauze 4 x 4's Kling and Kerlix a posterior splint was also placed on the left foot leg in the operating room.  Tourniquet was released after wrapping the toe and prompt complete vascularity was seen to return to all digits of the left foot.      Patient tolerated the procedure and anesthesia well.  Was transported from the OR to the PACU with all vital signs stable and vascular status intact. To be discharged per routine protocol.  Will follow up in approximately 1 week in the outpatient clinic.

## 2018-03-20 ENCOUNTER — Encounter: Payer: Self-pay | Admitting: Podiatry

## 2018-03-30 ENCOUNTER — Ambulatory Visit: Payer: 59 | Admitting: Obstetrics and Gynecology

## 2018-04-27 ENCOUNTER — Encounter: Payer: Self-pay | Admitting: Obstetrics and Gynecology

## 2018-04-27 ENCOUNTER — Ambulatory Visit (INDEPENDENT_AMBULATORY_CARE_PROVIDER_SITE_OTHER): Payer: Managed Care, Other (non HMO) | Admitting: Obstetrics and Gynecology

## 2018-04-27 VITALS — BP 126/78 | Ht 62.0 in | Wt 138.0 lb

## 2018-04-27 DIAGNOSIS — Z1331 Encounter for screening for depression: Secondary | ICD-10-CM

## 2018-04-27 DIAGNOSIS — Z124 Encounter for screening for malignant neoplasm of cervix: Secondary | ICD-10-CM

## 2018-04-27 DIAGNOSIS — Z01419 Encounter for gynecological examination (general) (routine) without abnormal findings: Secondary | ICD-10-CM

## 2018-04-27 DIAGNOSIS — Z1339 Encounter for screening examination for other mental health and behavioral disorders: Secondary | ICD-10-CM

## 2018-04-27 NOTE — Progress Notes (Signed)
Routine Annual Gynecology Examination   PCP: Steele Sizer, MD  Chief Complaint  Patient presents with  . Annual Exam   History of Present Illness: Patient is a 58 y.o. No obstetric history on file. presents for annual exam. The patient has no complaints today.   Menopausal bleeding: denies  Menopausal symptoms: denies  Breast symptoms: denies  Last pap smear: 2 years ago.  Result Normal  Last mammogram: 1 years ago.  Result Normal  Last colonoscopy: up to date, 2016  Past Medical History:  Diagnosis Date  . Asymptomatic varicose veins   . Bronchitis   . Bunion of right foot   . Complication of anesthesia    stopped breathing in recovery room from morphine  . Diverticulosis of colon (without mention of hemorrhage) 2010  . Headache(784.0)   . Osteoarthrosis, unspecified whether generalized or localized, unspecified site    hands  . Osteoporosis, unspecified   . Palpitations   . PONV (postoperative nausea and vomiting)   . Post-menopausal   . Post-nasal drainage     Past Surgical History:  Procedure Laterality Date  . AIKEN OSTEOTOMY Left 03/19/2018   Procedure: Barbie Banner OSTEOTOMY-(HALLUX VALGUS-DOUBLE OSTEOTOMY);  Surgeon: Albertine Patricia, DPM;  Location: [place];  Service: Podiatry;  Laterality: Left;  . BUNIONECTOMY    . COLON SURGERY  2010   sigmoid resection/rectopexy  . COLONOSCOPY  06/07/14,08/08/08,05/02/03,11/25/94  . DILATION AND CURETTAGE OF UTERUS  10/15   diagnostic/therapeutic  . ESOPHAGOGASTRODUODENOSCOPY  05/02/2003  . METATARSAL OSTEOTOMY Right 01/19/2015   Procedure: METATARSAL OSTEOTOMY DOUBLE 1ST MET & GREAT TOE PROXIMAL PHALANX;  Surgeon: Albertine Patricia, DPM;  Location: [place];  Service: Podiatry;  Laterality: Right;  LMA WITH LOCAL  . WEIL OSTEOTOMY Right 01/19/2015   Procedure: WEIL OSTEOTOMY 2ND TOE;  Surgeon: Albertine Patricia, DPM;  Location: [place];  Service: Podiatry;  Laterality: Right;    Prior to  Admission medications   Medication Sig Start Date End Date Taking? Authorizing Provider  calcium-vitamin D 250-100 MG-UNIT tablet Take 1 tablet by mouth 2 (two) times daily. 08/15/15  Yes Steele Sizer, MD    Allergies  Allergen Reactions  . Codeine     Hives Vomiting   . Hydrocodone-Acetaminophen Nausea Only and Nausea And Vomiting  . Oxycodone Nausea Only and Nausea And Vomiting  . Oxycodone Hcl Nausea Only and Nausea And Vomiting  . Propoxyphene Nausea Only and Nausea And Vomiting  . Propoxyphene Nausea Only and Nausea And Vomiting    Obstetric History: G0  Social History   Socioeconomic History  . Marital status: Married    Spouse name: Not on file  . Number of children: Not on file  . Years of education: Not on file  . Highest education level: Not on file  Occupational History  . Not on file  Social Needs  . Financial resource strain: Not on file  . Food insecurity:    Worry: Not on file    Inability: Not on file  . Transportation needs:    Medical: Not on file    Non-medical: Not on file  Tobacco Use  . Smoking status: Never Smoker  . Smokeless tobacco: Never Used  Substance and Sexual Activity  . Alcohol use: Yes    Alcohol/week: 0.0 standard drinks    Comment: Drinks wine socially./2-3 drinks per month  . Drug use: No  . Sexual activity: Yes    Partners: Male    Birth control/protection: Post-menopausal  Lifestyle  . Physical  activity:    Days per week: Not on file    Minutes per session: Not on file  . Stress: Not on file  Relationships  . Social connections:    Talks on phone: Not on file    Gets together: Not on file    Attends religious service: Not on file    Active member of club or organization: Not on file    Attends meetings of clubs or organizations: Not on file    Relationship status: Not on file  . Intimate partner violence:    Fear of current or ex partner: Not on file    Emotionally abused: Not on file    Physically abused: Not on  file    Forced sexual activity: Not on file  Other Topics Concern  . Not on file  Social History Narrative  . Not on file    Family History  Problem Relation Age of Onset  . Arrhythmia Mother        A-Fib  . Heart failure Mother   . Hypertension Mother   . Hyperlipidemia Mother   . Colon polyps Mother   . Hypertension Father   . Heart attack Father 6       MI  . Arrhythmia Brother        A-Fib  . Heart attack Paternal Grandfather 40  . Heart disease Paternal Grandfather   . Breast cancer Neg Hx     Review of Systems  Constitutional: Negative.   HENT: Negative.   Eyes: Negative.   Respiratory: Negative.   Cardiovascular: Negative.   Gastrointestinal: Negative.   Genitourinary: Negative.   Musculoskeletal: Negative.   Skin: Negative.   Neurological: Negative.   Psychiatric/Behavioral: Negative.      Physical Exam Vitals: BP 126/78   Ht _0  (1.575 m)   Wt 138 lb (62.6 kg)   BMI 25.24 kg/m   Physical Exam Constitutional:      General: She is not in acute distress.    Appearance: She is well-developed.  Genitourinary:     Pelvic exam was performed with patient supine.     Uterus normal.     No inguinal adenopathy present in the right or left side.    No signs of injury in the vagina.     No vaginal discharge, erythema, tenderness or bleeding.     No cervical motion tenderness, discharge, lesion or polyp.     Uterus is mobile.     Uterus is not enlarged or tender.     No uterine mass detected.    Uterus is anteverted.     No right or left adnexal mass present.     Right adnexa not tender or full.     Left adnexa not tender or full.  HENT:     Head: Normocephalic and atraumatic.  Eyes:     General: No scleral icterus.    Conjunctiva/sclera: Conjunctivae normal.  Neck:     Musculoskeletal: Normal range of motion and neck supple.     Thyroid: No thyromegaly.  Cardiovascular:     Rate and Rhythm: Normal rate and regular rhythm.     Heart sounds: No  murmur. No friction rub. No gallop.   Pulmonary:     Effort: Pulmonary effort is normal. No respiratory distress.     Breath sounds: Normal breath sounds. No wheezing or rales.  Chest:     Breasts:        Right: No inverted nipple, mass, nipple discharge,  skin change or tenderness.        Left: No inverted nipple, mass, nipple discharge, skin change or tenderness.  Abdominal:     General: Bowel sounds are normal. There is no distension.     Palpations: Abdomen is soft. There is no mass.     Tenderness: There is no abdominal tenderness. There is no guarding or rebound.  Musculoskeletal: Normal range of motion.        General: No tenderness.  Lymphadenopathy:     Cervical: No cervical adenopathy.     Lower Body: No right inguinal adenopathy. No left inguinal adenopathy.  Neurological:     Mental Status: She is alert and oriented to person, place, and time.     Cranial Nerves: No cranial nerve deficit.  Skin:    General: Skin is warm and dry.     Findings: No erythema or rash.  Psychiatric:        Mood and Affect: Mood normal.        Behavior: Behavior normal.        Judgment: Judgment normal.     Female chaperone present for pelvic and breast  portions of the physical exam  Results: AUDIT Questionnaire (screen for alcoholism): 4 PHQ-9: 0   Assessment and Plan:  58 y.o. No obstetric history on file. female here for routine annual gynecologic examination  Plan: Problem List Items Addressed This Visit    None    Visit Diagnoses    Women's annual routine gynecological examination    -  Primary   Relevant Orders   IGP, Aptima HPV, rfx 16/18,45   Screening for depression       Screening for alcoholism       Pap smear for cervical cancer screening       Relevant Orders   IGP, Aptima HPV, rfx 16/18,45      Screening: -- Blood pressure screen normal -- Colonoscopy - not due -- Mammogram - due - already scheduled at [facility] on 05/12/2018 -- Weight screening: normal --  Depression screening negative (PHQ-9) -- Nutrition: normal -- cholesterol screening: per PCP -- osteoporosis screening: not due -- tobacco screening: not using -- alcohol screening: AUDIT questionnaire indicates low-risk usage. -- family history of breast cancer screening: done. not at high risk. -- no evidence of domestic violence or intimate partner violence. -- STD screening: gonorrhea/chlamydia NAAT not collected per patient request. -- pap smear collected per ASCCP guidelines -- flu vaccine received at work -- HPV vaccination series: not Lanetta Inch, MD 04/27/2018 4:55 PM

## 2018-04-28 ENCOUNTER — Encounter: Payer: Self-pay | Admitting: Obstetrics and Gynecology

## 2018-05-03 LAB — IGP, APTIMA HPV, RFX 16/18,45: HPV Aptima: NEGATIVE

## 2018-05-04 ENCOUNTER — Encounter: Payer: Self-pay | Admitting: Obstetrics and Gynecology

## 2018-05-13 ENCOUNTER — Ambulatory Visit
Admission: RE | Admit: 2018-05-13 | Discharge: 2018-05-13 | Disposition: A | Discharge status: Home / Self Care | Payer: Managed Care, Other (non HMO) | Source: Ambulatory Visit | Attending: Obstetrics and Gynecology | Admitting: Obstetrics and Gynecology

## 2018-05-13 ENCOUNTER — Other Ambulatory Visit: Payer: Self-pay

## 2018-05-13 DIAGNOSIS — Z1231 Encounter for screening mammogram for malignant neoplasm of breast: Secondary | ICD-10-CM | POA: Insufficient documentation

## 2018-11-17 ENCOUNTER — Emergency Department: Payer: Managed Care, Other (non HMO)

## 2018-11-17 ENCOUNTER — Other Ambulatory Visit: Payer: Self-pay

## 2018-11-17 ENCOUNTER — Encounter: Payer: Self-pay | Admitting: Emergency Medicine

## 2018-11-17 ENCOUNTER — Emergency Department
Admission: EM | Admit: 2018-11-17 | Discharge: 2018-11-17 | Disposition: A | Discharge status: Home / Self Care | Payer: Managed Care, Other (non HMO) | Attending: Emergency Medicine | Admitting: Emergency Medicine

## 2018-11-17 ENCOUNTER — Ambulatory Visit: Payer: Self-pay

## 2018-11-17 DIAGNOSIS — R079 Chest pain, unspecified: Secondary | ICD-10-CM | POA: Diagnosis not present

## 2018-11-17 DIAGNOSIS — K449 Diaphragmatic hernia without obstruction or gangrene: Secondary | ICD-10-CM

## 2018-11-17 DIAGNOSIS — R2242 Localized swelling, mass and lump, left lower limb: Secondary | ICD-10-CM | POA: Diagnosis not present

## 2018-11-17 DIAGNOSIS — M79662 Pain in left lower leg: Secondary | ICD-10-CM | POA: Diagnosis not present

## 2018-11-17 DIAGNOSIS — Z79899 Other long term (current) drug therapy: Secondary | ICD-10-CM | POA: Insufficient documentation

## 2018-11-17 DIAGNOSIS — R0789 Other chest pain: Secondary | ICD-10-CM | POA: Diagnosis present

## 2018-11-17 LAB — CBC
HCT: 40.4 % (ref 36.0–46.0)
Hemoglobin: 13.5 g/dL (ref 12.0–15.0)
MCH: 29.9 pg (ref 26.0–34.0)
MCHC: 33.4 g/dL (ref 30.0–36.0)
MCV: 89.6 fL (ref 80.0–100.0)
Platelets: 197 10*3/uL (ref 150–400)
RBC: 4.51 MIL/uL (ref 3.87–5.11)
RDW: 13.6 % (ref 11.5–15.5)
WBC: 6.7 10*3/uL (ref 4.0–10.5)
nRBC: 0 % (ref 0.0–0.2)

## 2018-11-17 LAB — BASIC METABOLIC PANEL
Anion gap: 8 (ref 5–15)
BUN: 15 mg/dL (ref 6–20)
CO2: 30 mmol/L (ref 22–32)
Calcium: 9.7 mg/dL (ref 8.9–10.3)
Chloride: 102 mmol/L (ref 98–111)
Creatinine, Ser: 0.53 mg/dL (ref 0.44–1.00)
GFR calc Af Amer: >60 mL/min (ref >60)
GFR calc non Af Amer: >60 mL/min (ref >60)
Glucose, Bld: 75 mg/dL (ref 70–99)
Potassium: 3.9 mmol/L (ref 3.5–5.1)
Sodium: 140 mmol/L (ref 135–145)

## 2018-11-17 LAB — TROPONIN I (HIGH SENSITIVITY): Troponin I (High Sensitivity): 3 ng/L (ref <18)

## 2018-11-17 LAB — FIBRIN DERIVATIVES D-DIMER (ARMC ONLY): Fibrin derivatives D-dimer (ARMC): 345.82 ng/mL (FEU) (ref 0.00–499.00)

## 2018-11-17 MED ORDER — PANTOPRAZOLE SODIUM 40 MG PO TBEC: 40.0000 mg | DELAYED_RELEASE_TABLET | Freq: Every day | ORAL | 1 refills | Status: DC

## 2018-11-17 MED ORDER — SODIUM CHLORIDE 0.9% FLUSH: 3.0000 mL | Freq: Once | INTRAVENOUS | Status: DC

## 2018-11-17 MED ORDER — IOHEXOL 350 MG/ML SOLN
75.0000 mL | Freq: Once | INTRAVENOUS | Status: AC | PRN
  Administered 2018-11-17: 12:00:00 75 mL via INTRAVENOUS
  Filled 2018-11-17: qty 75

## 2018-11-17 NOTE — ED Triage Notes (Signed)
Pt here with c/o chest pressure that began about a month ago, passed it off for stress from work. Here wanting to get checked out to be sure her chest pressure isn't anything concerning.  Also c/o left calf pain, swelling, and cramping over the past few days. Headache as well. NAD.

## 2018-11-17 NOTE — Telephone Encounter (Signed)
Pt. Reports she noticed last week pain and swelling to left calf. Swelling gets better at night while leg is elevated.Has muscle cramps in that foot and calf. No redness. Pain is 3/10. No availability in the practice today per [place]. Pt. Will go to [facility] for evaluation today.  Reason for Disposition . [1] Thigh or calf pain AND [2] only 1 side AND [3] present > 1 hour  Answer Assessment - Initial Assessment Questions 1. ONSET: "When did the pain start?"      1 week ago 2. LOCATION: "Where is the pain located?"      Left leg - calf 3. PAIN: "How bad is the pain?"    (Scale 1-10; or mild, moderate, severe)   -  MILD (1-3): doesn't interfere with normal activities    -  MODERATE (4-7): interferes with normal activities (e.g., work or school) or awakens from sleep, limping    -  SEVERE (8-10): excruciating pain, unable to do any normal activities, unable to walk     3 4. WORK OR EXERCISE: "Has there been any recent work or exercise that involved this part of the body?"      No 5. CAUSE: "What do you think is causing the leg pain?"     Maybe a blood clot 6. OTHER SYMPTOMS: "Do you have any other symptoms?" (e.g., chest pain, back pain, breathing difficulty, swelling, rash, fever, numbness, weakness)     Muscle cramp - in her foot and calf 7. PREGNANCY: "Is there any chance you are pregnant?" "When was your last menstrual period?"     No  Protocols used: LEG PAIN-A-AH

## 2018-11-17 NOTE — ED Notes (Signed)
NAD noted at time of D/C. Pt denies questions or concerns. Pt ambulatory to the lobby at this time.  

## 2018-11-17 NOTE — ED Notes (Signed)
MD at bedside. 

## 2018-11-17 NOTE — ED Notes (Signed)
Patient transported to CT 

## 2018-11-17 NOTE — ED Provider Notes (Signed)
[facility] Emergency Department Provider Note   First MD Initiated Contact with Patient 11/17/18 1152     (approximate)  I have reviewed the triage vital signs and the nursing notes.   HISTORY  Chief Complaint Chest Pain, Leg Pain, and Leg Swelling   HPI Judy Butler is a 58 y.o. female presents to the emergency department with a 1 month history of chest discomfort that is intermittent in nature.  Patient also admits to left lower extremity calf pain and swelling for the past few days.  Patient denies any history of DVT PE or CAD.  Patient does however admit to familial history of CAD/MI.  Patient states that current pain score is 2 out of 10.  Patient denies any dizziness no diaphoresis no nausea or vomiting.  Patient states that chest discomfort consistent with possible reflux.        Past Medical History:  Diagnosis Date   Asymptomatic varicose veins    Bronchitis    Bunion of right foot    Complication of anesthesia    stopped breathing in recovery room from morphine   Diverticulosis of colon (without mention of hemorrhage) 2010   Headache(784.0)    Osteoarthrosis, unspecified whether generalized or localized, unspecified site    hands   Osteoporosis, unspecified    Palpitations    PONV (postoperative nausea and vomiting)    Post-menopausal    Post-nasal drainage     Patient Active Problem List   Diagnosis Date Noted   Diverticulitis of colon without hemorrhage 11/08/2014   Primary osteoarthritis involving multiple joints 11/08/2014   Blood pressure elevated without history of HTN 11/08/2014   Lumbosacral injury 11/08/2014   Bilateral hip pain 11/08/2014   Palpitations 03/29/2013   Asymptomatic varicose veins 09/06/2009   Migraine without aura 07/13/2008   Osteopenia 04/14/2007    Past Surgical History:  Procedure Laterality Date   AIKEN OSTEOTOMY Left 03/19/2018   Procedure: Barbie Banner OSTEOTOMY-(HALLUX  VALGUS-DOUBLE OSTEOTOMY);  Surgeon: Albertine Patricia, DPM;  Location: [place];  Service: Podiatry;  Laterality: Left;   BUNIONECTOMY     COLON SURGERY  2010   sigmoid resection/rectopexy   COLONOSCOPY  06/07/14,08/08/08,05/02/03,11/25/94   DILATION AND CURETTAGE OF UTERUS  10/15   diagnostic/therapeutic   ESOPHAGOGASTRODUODENOSCOPY  05/02/2003   METATARSAL OSTEOTOMY Right 01/19/2015   Procedure: METATARSAL OSTEOTOMY DOUBLE 1ST MET & GREAT TOE PROXIMAL PHALANX;  Surgeon: Albertine Patricia, DPM;  Location: [place];  Service: Podiatry;  Laterality: Right;  LMA WITH LOCAL   WEIL OSTEOTOMY Right 01/19/2015   Procedure: WEIL OSTEOTOMY 2ND TOE;  Surgeon: Albertine Patricia, DPM;  Location: [place];  Service: Podiatry;  Laterality: Right;    Prior to Admission medications   Medication Sig Start Date End Date Taking? Authorizing Provider  benzonatate (TESSALON) 100 MG capsule Take by mouth 3 (three) times daily as needed for cough.    [provider]  calcium-vitamin D 250-100 MG-UNIT tablet Take 1 tablet by mouth 2 (two) times daily. 08/15/15   Steele Sizer, MD  HYDROcodone-acetaminophen (NORCO) 5-325 MG tablet Take 1 tablet by mouth every 6 (six) hours as needed for moderate pain. Patient not taking: Reported on 04/27/2018 03/19/18   Albertine Patricia, DPM  Nutritional Supplements (JUICE PLUS FIBRE PO) Take by mouth.    [provider]  promethazine (PHENERGAN) 12.5 MG tablet Take 1 tablet (12.5 mg total) by mouth every 6 (six) hours as needed for nausea or vomiting. Patient not taking: Reported on  04/27/2018 03/19/18   Albertine Patricia, DPM    Allergies Codeine, Hydrocodone-acetaminophen, Oxycodone, Oxycodone hcl, Propoxyphene, and Propoxyphene  Family History  Problem Relation Age of Onset   Arrhythmia Mother        A-Fib   Heart failure Mother    Hypertension Mother    Hyperlipidemia Mother    Colon polyps Mother    Hypertension  Father    Heart attack Father 79       MI   Arrhythmia Brother        A-Fib   Heart attack Paternal Grandfather 29   Heart disease Paternal Grandfather    Breast cancer Neg Hx     Social History Social History   Tobacco Use   Smoking status: Never Smoker   Smokeless tobacco: Never Used  Substance Use Topics   Alcohol use: Yes    Alcohol/week: 0.0 standard drinks    Comment: Drinks wine socially./2-3 drinks per month   Drug use: No    Review of Systems Constitutional: No fever/chills Eyes: No visual changes. ENT: No sore throat. Cardiovascular: Positive for chest pain. Respiratory: Denies shortness of breath. Gastrointestinal: No abdominal pain.  No nausea, no vomiting.  No diarrhea.  No constipation. Genitourinary: Negative for dysuria. Musculoskeletal: Negative for neck pain.  Negative for back pain. Integumentary: Negative for rash. Neurological: Negative for headaches, focal weakness or numbness.   ____________________________________________   PHYSICAL EXAM:  VITAL SIGNS: ED Triage Vitals [11/17/18 1005]  Enc Vitals Group     BP (!) 149/94     Pulse Rate 90     Resp 18     Temp      Temp src      SpO2 97 %     Weight 64.4 kg (142 lb)     Height 1.575 m (5' 2" )     Head Circumference      Peak Flow      Pain Score 2     Pain Loc      Pain Edu?      Excl. in [place]?     Constitutional: Alert and oriented.  Eyes: Conjunctivae are normal.  Mouth/Throat: Mucous membranes are moist. Neck: No stridor.  No meningeal signs.   Cardiovascular: Normal rate, regular rhythm. Good peripheral circulation. Grossly normal heart sounds. Respiratory: Normal respiratory effort.  No retractions. Gastrointestinal: Soft and nontender. No distention.  Musculoskeletal: No lower extremity tenderness nor edema. No gross deformities of extremities. Neurologic:  Normal speech and language. No gross focal neurologic deficits are appreciated.  Skin:  Skin is warm, dry  and intact. Psychiatric: Mood and affect are normal. Speech and behavior are normal.  ____________________________________________   LABS (all labs ordered are listed, but only abnormal results are displayed)  Labs Reviewed  BASIC METABOLIC PANEL  CBC  FIBRIN DERIVATIVES D-DIMER ([facility] ONLY)  TROPONIN I (HIGH SENSITIVITY)  TROPONIN I (HIGH SENSITIVITY)   ____________________________________________  EKG  ED ECG REPORT I, [place] N Mattheu Brodersen, the attending physician, personally viewed and interpreted this ECG.   Date: 11/17/2018  EKG Time: 10:02AM  Rate: 81  Rhythm:Normal Sinus Rhythm  Axis: Normal  Intervals:Normal  ST&T Change: None  ____________________________________________  RADIOLOGY I, [place] N Davaris Youtsey, personally viewed and evaluated these images (plain radiographs) as part of my medical decision making, as well as reviewing the written report by the radiologist.  ED MD interpretation: CT angiogram revealed no evidence of pulmonary emboli.  However did reveal evidence of a hiatal hernia.  Official radiology report(s):  Dg Chest 2 View  Result Date: 11/17/2018 CLINICAL DATA:  Chest pain, hypertension EXAM: CHEST - 2 VIEW COMPARISON:  08/21/2017 FINDINGS: The heart size and mediastinal contours are within normal limits. Both lungs are clear. The visualized skeletal structures are unremarkable. IMPRESSION: No active cardiopulmonary disease. Electronically Signed   By: Davina Poke M.D.   On: 11/17/2018 10:54   Ct Angio Chest Pe W And/or Wo Contrast  Result Date: 11/17/2018 CLINICAL DATA:  Chest pain for 1 month.  Left calf pain EXAM: CT ANGIOGRAPHY CHEST WITH CONTRAST TECHNIQUE: Multidetector CT imaging of the chest was performed using the standard protocol during bolus administration of intravenous contrast. Multiplanar CT image reconstructions and MIPs were obtained to evaluate the vascular anatomy. CONTRAST:  71m OMNIPAQUE IOHEXOL 350 MG/ML SOLN COMPARISON:   08/21/2017 FINDINGS: Cardiovascular: Satisfactory opacification of the pulmonary arteries to the segmental level. No evidence of pulmonary embolism. Normal heart size. No pericardial effusion. Mediastinum/Nodes: No axillary, mediastinal, or hilar lymphadenopathy. Unremarkable thyroid gland and trachea. Moderate-sized hiatal hernia. Lungs/Pleura: Lungs are clear. No pleural effusion or pneumothorax. Upper Abdomen: No acute abnormality. Musculoskeletal: No chest wall abnormality. No acute or significant osseous findings. Review of the MIP images confirms the above findings. IMPRESSION: 1. Negative for pulmonary embolism. 2. Lungs are clear. 3. Moderate-sized hiatal hernia. Electronically Signed   By: NDavina PokeM.D.   On: 11/17/2018 12:24   UKoreaVenous Img Lower Unilateral Left  Result Date: 11/17/2018 CLINICAL DATA:  Left lower extremity pain. EXAM: LEFT LOWER EXTREMITY VENOUS DOPPLER ULTRASOUND TECHNIQUE: Gray-scale sonography with graded compression, as well as color Doppler and duplex ultrasound were performed to evaluate the lower extremity deep venous systems from the level of the common femoral vein and including the common femoral, femoral, profunda femoral, popliteal and calf veins including the posterior tibial, peroneal and gastrocnemius veins when visible. The superficial great saphenous vein was also interrogated. Spectral Doppler was utilized to evaluate flow at rest and with distal augmentation maneuvers in the common femoral, femoral and popliteal veins. COMPARISON:  CT 11/17/2018. FINDINGS: Contralateral Common Femoral Vein: Respiratory phasicity is normal and symmetric with the symptomatic side. No evidence of thrombus. Normal compressibility. Common Femoral Vein: No evidence of thrombus. Normal compressibility, respiratory phasicity and response to augmentation. Saphenofemoral Junction: No evidence of thrombus. Normal compressibility and flow on color Doppler imaging. Profunda Femoral Vein:  No evidence of thrombus. Normal compressibility and flow on color Doppler imaging. Femoral Vein: No evidence of thrombus. Normal compressibility, respiratory phasicity and response to augmentation. Popliteal Vein: No evidence of thrombus. Normal compressibility, respiratory phasicity and response to augmentation. Calf Veins: No evidence of thrombus. Normal compressibility and flow on color Doppler imaging. Superficial Great Saphenous Vein: No evidence of thrombus. Normal compressibility. Other Findings:  None. IMPRESSION: No evidence of deep venous thrombosis. Electronically Signed   By: TMarcello Moores Register   On: 11/17/2018 13:02     Procedures   ____________________________________________   INITIAL IMPRESSION / MDM / [place]/ ED COURSE  As part of my medical decision making, I reviewed the following data within the electronic MEDICAL RECORD NUMBER   58year old female presented with above-stated history and physical exam concerning for possible CAD/PE/DVT.  As such ultrasound of the lower extremity was performed.  No evidence of DVT.  CT angiogram of the chest performed revealed no evidence of pulmonary emboli however did reveal evidence of a hiatal hernia which may be the cause of the patient's chest discomfort and feelings of indigestion.  In addition troponin was performed which was negative.  Given the patient's strong familial history of CAD/MI will however refer the patient to cardiology for further outpatient evaluation ____________________________________________  FINAL CLINICAL IMPRESSION(S) / ED DIAGNOSES  Final diagnoses:  Hiatal hernia  Chest pain, unspecified type     MEDICATIONS GIVEN DURING THIS VISIT:  Medications  sodium chloride flush (NS) 0.9 % injection 3 mL (has no administration in time range)  iohexol (OMNIPAQUE) 350 MG/ML injection 75 mL (75 mLs Intravenous Contrast Given 11/17/18 1210)     ED Discharge Orders    None      *Please note:  Judy Butler was evaluated in Emergency Department on 11/17/2018 for the symptoms described in the history of present illness. She was evaluated in the context of the global COVID-19 pandemic, which necessitated consideration that the patient might be at risk for infection with the SARS-CoV-2 virus that causes COVID-19. Institutional protocols and algorithms that pertain to the evaluation of patients at risk for COVID-19 are in a state of rapid change based on information released by regulatory bodies including the CDC and federal and state organizations. These policies and algorithms were followed during the patient's care in the ED.  Some ED evaluations and interventions may be delayed as a result of limited staffing during the pandemic.*  Note:  This document was prepared using Dragon voice recognition software and may include unintentional dictation errors.   Gregor Hams, MD 11/17/18 1323

## 2018-12-01 ENCOUNTER — Ambulatory Visit (INDEPENDENT_AMBULATORY_CARE_PROVIDER_SITE_OTHER): Payer: Managed Care, Other (non HMO) | Admitting: Family Medicine

## 2018-12-01 ENCOUNTER — Encounter: Payer: Self-pay | Admitting: Family Medicine

## 2018-12-01 VITALS — BP 136/83 | Temp 98.0°F | Wt 141.0 lb

## 2018-12-01 DIAGNOSIS — L509 Urticaria, unspecified: Secondary | ICD-10-CM

## 2018-12-01 DIAGNOSIS — Z23 Encounter for immunization: Secondary | ICD-10-CM | POA: Diagnosis not present

## 2018-12-01 DIAGNOSIS — K449 Diaphragmatic hernia without obstruction or gangrene: Secondary | ICD-10-CM | POA: Diagnosis not present

## 2018-12-01 MED ORDER — ZOSTER VAC RECOMB ADJUVANTED 50 MCG/0.5ML IM SUSR: 0.5000 mL | Freq: Once | INTRAMUSCULAR | 1 refills | Status: AC

## 2018-12-01 MED ORDER — PANTOPRAZOLE SODIUM 40 MG PO TBEC: 40.0000 mg | DELAYED_RELEASE_TABLET | Freq: Every day | ORAL | 1 refills | Status: DC

## 2018-12-01 NOTE — Progress Notes (Signed)
Name: Judy Butler   MRN: [id number]    DOB: [birth year]   Date:12/01/2018       Progress Note  Subjective  Chief Complaint  Chief Complaint  Patient presents with  . Chest Pain    She continues to have chest pain, but she knows that it was not related to her heart. She had cardiac workup. She was dx. with having a Hiatal Hernia. She is now taking protonix. She also has alot of limitation that she can not abide to.  . Hiatal Hernia  . Herpes Zoster    She would like to have the shingrix. She will need a printed prescription for it. [facility] [address] connected with  Aura Camps  on 12/01/18 at  8:40 AM EDT by a video enabled telemedicine application and verified that I am speaking with the correct person using two identifiers.  I discussed the limitations of evaluation and management by telemedicine and the availability of in person appointments. The patient expressed understanding and agreed to proceed. Staff also discussed with the patient that there may be a patient responsible charge related to this service. Patient Location: at home  Provider Location: [facility]    HPI  [facility] follow up: she went to [facility] on 11/17/2018 for chest pain and left calf tenderness , had normal EKG, normal CXR, negative CT angiogram except for moderate  hiatal hernia finding, also had negative doppler US. Normal CBC, BMP, negative troponin and D-dimer . She states pain is finally improving with pantoprazole. She states currently still has some pain, described as something heavy on her sub-sternal area, she states coffee, tomatoes makes symptoms worse, she is avoiding alcohol, caffeine and citric food   Hives: started started about one month, seen by Dermatologist, and had multiple studies done , she started to feel better but is happening again. She saw Dr. Donneta Romberg, she had allergy test that was negative and was told likely secondary to stress. She has a cream and anti-histamine .  She states during much better now.    Patient Active Problem List   Diagnosis Date Noted  . Diverticulitis of colon without hemorrhage 11/08/2014  . Primary osteoarthritis involving multiple joints 11/08/2014  . Blood pressure elevated without history of HTN 11/08/2014  . Lumbosacral injury 11/08/2014  . Bilateral hip pain 11/08/2014  . Palpitations 03/29/2013  . Asymptomatic varicose veins 09/06/2009  . Migraine without aura 07/13/2008  . Osteopenia 04/14/2007    Past Surgical History:  Procedure Laterality Date  . AIKEN OSTEOTOMY Left 03/19/2018   Procedure: Barbie Banner OSTEOTOMY-(HALLUX VALGUS-DOUBLE OSTEOTOMY);  Surgeon: Albertine Patricia, DPM;  Location: [place];  Service: Podiatry;  Laterality: Left;  . BUNIONECTOMY    . COLON SURGERY  2010   sigmoid resection/rectopexy  . COLONOSCOPY  06/07/14,08/08/08,05/02/03,11/25/94  . DILATION AND CURETTAGE OF UTERUS  10/15   diagnostic/therapeutic  . ESOPHAGOGASTRODUODENOSCOPY  05/02/2003  . METATARSAL OSTEOTOMY Right 01/19/2015   Procedure: METATARSAL OSTEOTOMY DOUBLE 1ST MET & GREAT TOE PROXIMAL PHALANX;  Surgeon: Albertine Patricia, DPM;  Location: [place];  Service: Podiatry;  Laterality: Right;  LMA WITH LOCAL  . WEIL OSTEOTOMY Right 01/19/2015   Procedure: WEIL OSTEOTOMY 2ND TOE;  Surgeon: Albertine Patricia, DPM;  Location: [place];  Service: Podiatry;  Laterality: Right;    Family History  Problem Relation Age of Onset  . Arrhythmia Mother        A-Fib  . Heart failure Mother   .  Hypertension Mother   . Hyperlipidemia Mother   . Colon polyps Mother   . Hypertension Father   . Heart attack Father 46       MI  . Arrhythmia Brother        A-Fib  . Heart attack Paternal Grandfather 70  . Heart disease Paternal Grandfather   . Breast cancer Neg Hx     Social History   Socioeconomic History  . Marital status: Married    Spouse name: Not on file  . Number of children: 0  . Years of education: Not  on file  . Highest education level: Bachelor's degree (e.g., BA, AB, BS)  Occupational History  . Occupation: VP of HR     Comment: human resources   Social Needs  . Financial resource strain: Not hard at all  . Food insecurity    Worry: Never true    Inability: Never true  . Transportation needs    Medical: No    Non-medical: No  Tobacco Use  . Smoking status: Never Smoker  . Smokeless tobacco: Never Used  Substance and Sexual Activity  . Alcohol use: Yes    Alcohol/week: 0.0 standard drinks    Comment: Drinks wine socially./2-3 drinks per month  . Drug use: No  . Sexual activity: Yes    Partners: Male    Birth control/protection: Post-menopausal  Lifestyle  . Physical activity    Days per week: 3 days    Minutes per session: 30 min  . Stress: Not at all  Relationships  . Social Herbalist on phone: Not on file    Gets together: Not on file    Attends religious service: Not on file    Active member of club or organization: Not on file    Attends meetings of clubs or organizations: Not on file    Relationship status: Not on file  . Intimate partner violence    Fear of current or ex partner: No    Emotionally abused: No    Physically abused: No    Forced sexual activity: No  Other Topics Concern  . Not on file  Social History Narrative  . Not on file     Current Outpatient Medications:  .  EPINEPHrine 0.3 mg/0.3 mL IJ SOAJ injection, INJECT INTRAMUSCULARLY AS DIRECTED, Disp: , Rfl:  .  calcium-vitamin D 250-100 MG-UNIT tablet, Take 1 tablet by mouth 2 (two) times daily., Disp: 60 tablet, Rfl: 0 .  Nutritional Supplements (JUICE PLUS FIBRE PO), Take by mouth., Disp: , Rfl:  .  pantoprazole (PROTONIX) 40 MG tablet, Take 1 tablet (40 mg total) by mouth daily., Disp: 30 tablet, Rfl: 1  Allergies  Allergen Reactions  . Codeine     Hives Vomiting   . Hydrocodone-Acetaminophen Nausea Only and Nausea And Vomiting  . Oxycodone Nausea Only and Nausea And  Vomiting  . Oxycodone Hcl Nausea Only and Nausea And Vomiting  . Propoxyphene Nausea Only and Nausea And Vomiting  . Propoxyphene Nausea Only and Nausea And Vomiting    I personally reviewed active problem list, medication list, allergies, family history, social history, health maintenance with the patient/caregiver today.   ROS  Ten systems reviewed and is negative except as mentioned in HPI   Objective  Virtual encounter, vitals not obtained.  Body mass index is 25.79 kg/m.  Physical Exam  Awake, alert and oriented  PHQ2/9: Depression screen [facility] 2/9 12/01/2018 12/01/2017 06/20/2015 11/08/2014  Decreased Interest 0 0 0  0  Down, Depressed, Hopeless 0 0 0 0  PHQ - 2 Score 0 0 0 0  Altered sleeping 0 - - -  Tired, decreased energy 0 - - -  Change in appetite 0 - - -  Feeling bad or failure about yourself  0 - - -  Trouble concentrating 0 - - -  Moving slowly or fidgety/restless 0 - - -  Suicidal thoughts 0 - - -  PHQ-9 Score 0 - - -  Difficult doing work/chores Somewhat difficult - - -   PHQ-2/9 Result is negative.    Fall Risk: Fall Risk  12/01/2018 12/01/2017 12/01/2017 06/20/2015 11/08/2014  Falls in the past year? 0 No No No Yes  Number falls in past yr: 0 - - - 1  Injury with Fall? 0 - - - Yes     Assessment & Plan  1. Hiatal hernia  - pantoprazole (PROTONIX) 40 MG tablet; Take 1 tablet (40 mg total) by mouth daily.  Dispense: 90 tablet; Refill: 1 - Ambulatory referral to General Surgery  2. Hives  I discussed the assessment and treatment plan with the patient. The patient was provided an opportunity to ask questions and all were answered. The patient agreed with the plan and demonstrated an understanding of the instructions.  3. Need for shingles vaccine  - Varicella-zoster vaccine IM - she will go to pharmacy  The patient was advised to call back or seek an in-person evaluation if the symptoms worsen or if the condition fails to improve as anticipated.  I  provided 25 minutes of non-face-to-face time during this encounter.

## 2018-12-10 ENCOUNTER — Other Ambulatory Visit: Payer: Self-pay | Admitting: Surgery

## 2018-12-10 DIAGNOSIS — K449 Diaphragmatic hernia without obstruction or gangrene: Secondary | ICD-10-CM

## 2018-12-15 ENCOUNTER — Other Ambulatory Visit: Payer: Self-pay

## 2018-12-15 ENCOUNTER — Ambulatory Visit
Admission: RE | Admit: 2018-12-15 | Discharge: 2018-12-15 | Disposition: A | Discharge status: Home / Self Care | Payer: Managed Care, Other (non HMO) | Source: Ambulatory Visit | Attending: Surgery | Admitting: Surgery

## 2018-12-15 DIAGNOSIS — K449 Diaphragmatic hernia without obstruction or gangrene: Secondary | ICD-10-CM | POA: Diagnosis not present

## 2019-03-05 DIAGNOSIS — K52831 Collagenous colitis: Secondary | ICD-10-CM

## 2019-03-05 HISTORY — DX: Collagenous colitis: K52.831

## 2019-04-05 ENCOUNTER — Other Ambulatory Visit: Payer: Self-pay | Admitting: Obstetrics and Gynecology

## 2019-04-05 DIAGNOSIS — Z1231 Encounter for screening mammogram for malignant neoplasm of breast: Secondary | ICD-10-CM

## 2019-04-19 ENCOUNTER — Encounter: Payer: Self-pay | Admitting: Family Medicine

## 2019-05-03 ENCOUNTER — Ambulatory Visit (INDEPENDENT_AMBULATORY_CARE_PROVIDER_SITE_OTHER): Payer: Managed Care, Other (non HMO) | Admitting: Obstetrics and Gynecology

## 2019-05-03 ENCOUNTER — Other Ambulatory Visit: Payer: Self-pay

## 2019-05-03 ENCOUNTER — Encounter: Payer: Self-pay | Admitting: Obstetrics and Gynecology

## 2019-05-03 VITALS — BP 126/74 | Ht 62.0 in | Wt 145.0 lb

## 2019-05-03 DIAGNOSIS — Z01419 Encounter for gynecological examination (general) (routine) without abnormal findings: Secondary | ICD-10-CM | POA: Diagnosis not present

## 2019-05-03 DIAGNOSIS — Z1339 Encounter for screening examination for other mental health and behavioral disorders: Secondary | ICD-10-CM

## 2019-05-03 DIAGNOSIS — Z1331 Encounter for screening for depression: Secondary | ICD-10-CM | POA: Diagnosis not present

## 2019-05-03 NOTE — Progress Notes (Signed)
Routine Annual Gynecology Examination   PCP: Steele Sizer, MD  Chief Complaint  Patient presents with  . Annual Exam    History of Present Illness: Patient is a 58 y.o. No obstetric history on file. presents for annual exam. The patient has no complaints today.   Menopausal bleeding: denies  Menopausal symptoms: denies  Breast symptoms: denies  Last pap smear: 1 year ago.  Result Normal, HPV negative  Last mammogram: 1 year ago.  Result Normal   Last colonoscopy: up to date, 2016  Past Medical History:  Diagnosis Date  . Asymptomatic varicose veins   . Bronchitis   . Bunion of right foot   . Complication of anesthesia    stopped breathing in recovery room from morphine  . Diverticulosis of colon (without mention of hemorrhage) 2010  . Headache(784.0)   . Osteoarthrosis, unspecified whether generalized or localized, unspecified site    hands  . Osteoporosis, unspecified   . Palpitations   . PONV (postoperative nausea and vomiting)   . Post-menopausal   . Post-nasal drainage     Past Surgical History:  Procedure Laterality Date  . AIKEN OSTEOTOMY Left 03/19/2018   Procedure: Barbie Banner OSTEOTOMY-(HALLUX VALGUS-DOUBLE OSTEOTOMY);  Surgeon: Albertine Patricia, DPM;  Location: [place];  Service: Podiatry;  Laterality: Left;  . BUNIONECTOMY    . COLON SURGERY  2010   sigmoid resection/rectopexy  . COLONOSCOPY  06/07/14,08/08/08,05/02/03,11/25/94  . DILATION AND CURETTAGE OF UTERUS  10/15   diagnostic/therapeutic  . ESOPHAGOGASTRODUODENOSCOPY  05/02/2003  . METATARSAL OSTEOTOMY Right 01/19/2015   Procedure: METATARSAL OSTEOTOMY DOUBLE 1ST MET & GREAT TOE PROXIMAL PHALANX;  Surgeon: Albertine Patricia, DPM;  Location: [place];  Service: Podiatry;  Laterality: Right;  LMA WITH LOCAL  . WEIL OSTEOTOMY Right 01/19/2015   Procedure: WEIL OSTEOTOMY 2ND TOE;  Surgeon: Albertine Patricia, DPM;  Location: [place];  Service: Podiatry;  Laterality: Right;     Prior to Admission medications   Medication Sig Start Date End Date Taking? Authorizing Provider  calcium-vitamin D 250-100 MG-UNIT tablet Take 1 tablet by mouth 2 (two) times daily. 08/15/15  Yes Sowles, Drue Stager, MD  EPINEPHrine 0.3 mg/0.3 mL IJ SOAJ injection INJECT INTRAMUSCULARLY AS DIRECTED 10/21/18  Yes [provider]  Nutritional Supplements (JUICE PLUS FIBRE PO) Take by mouth.   Yes [provider]  pantoprazole (PROTONIX) 40 MG tablet Take 1 tablet (40 mg total) by mouth daily. 12/01/18 01/30/19  Steele Sizer, MD    Allergies  Allergen Reactions  . Codeine     Hives Vomiting   . Hydrocodone-Acetaminophen Nausea Only and Nausea And Vomiting  . Oxycodone Nausea Only and Nausea And Vomiting  . Oxycodone Hcl Nausea Only and Nausea And Vomiting  . Propoxyphene Nausea Only and Nausea And Vomiting  . Propoxyphene Nausea Only and Nausea And Vomiting    Obstetric History: G0  Social History   Socioeconomic History  . Marital status: Married    Spouse name: Not on file  . Number of children: 0  . Years of education: Not on file  . Highest education level: Bachelor's degree (e.g., BA, AB, BS)  Occupational History  . Occupation: VP of HR     Comment: human resources   Tobacco Use  . Smoking status: Never Smoker  . Smokeless tobacco: Never Used  Substance and Sexual Activity  . Alcohol use: Yes    Alcohol/week: 0.0 standard drinks    Comment: Drinks wine socially./2-3 drinks per month  . Drug use:  No  . Sexual activity: Yes    Partners: Male    Birth control/protection: Post-menopausal  Other Topics Concern  . Not on file  Social History Narrative  . Not on file   Social Determinants of Health   Financial Resource Strain: Low Risk   . Difficulty of Paying Living Expenses: Not hard at all  Food Insecurity: No Food Insecurity  . Worried About Charity fundraiser in the Last Year: Never true  . Ran Out of Food in the Last Year: Never true   Transportation Needs: No Transportation Needs  . Lack of Transportation (Medical): No  . Lack of Transportation (Non-Medical): No  Physical Activity: Insufficiently Active  . Days of Exercise per Week: 3 days  . Minutes of Exercise per Session: 30 min  Stress: No Stress Concern Present  . Feeling of Stress : Not at all  Social Connections:   . Frequency of Communication with Friends and Family: Not on file  . Frequency of Social Gatherings with Friends and Family: Not on file  . Attends Religious Services: Not on file  . Active Member of Clubs or Organizations: Not on file  . Attends Archivist Meetings: Not on file  . Marital Status: Not on file  Intimate Partner Violence: Not At Risk  . Fear of Current or Ex-Partner: No  . Emotionally Abused: No  . Physically Abused: No  . Sexually Abused: No    Family History  Problem Relation Age of Onset  . Arrhythmia Mother        A-Fib  . Heart failure Mother   . Hypertension Mother   . Hyperlipidemia Mother   . Colon polyps Mother   . Hypertension Father   . Heart attack Father 65       MI  . Arrhythmia Brother        A-Fib  . Heart attack Paternal Grandfather 15  . Heart disease Paternal Grandfather   . Breast cancer Neg Hx     Review of Systems  Constitutional: Negative.   HENT: Negative.   Eyes: Negative.   Respiratory: Negative.   Cardiovascular: Negative.   Gastrointestinal: Negative.   Genitourinary: Negative.   Musculoskeletal: Negative.   Skin: Negative.   Neurological: Negative.   Psychiatric/Behavioral: Negative.      Physical Exam Vitals: BP 126/74   Ht 5' 2"  (1.575 m)   Wt 145 lb (65.8 kg)   BMI 26.52 kg/m   Physical Exam Constitutional:      General: She is not in acute distress.    Appearance: Normal appearance. She is well-developed.  Genitourinary:     Pelvic exam was performed with patient in the lithotomy position.     Vulva, urethra, bladder and uterus normal.     No inguinal  adenopathy present in the right or left side.    No signs of injury in the vagina.     Vaginal atrophy present.     No vaginal discharge, erythema, tenderness or bleeding.     No cervical motion tenderness, discharge, lesion or polyp.     Uterus is mobile.     Uterus is not enlarged or tender.     No uterine mass detected.    Uterus is anteverted.     No right or left adnexal mass present.     Right adnexa not tender or full.     Left adnexa not tender or full.  HENT:     Head: Normocephalic and atraumatic.  Eyes:     General: No scleral icterus.    Conjunctiva/sclera: Conjunctivae normal.  Neck:     Thyroid: No thyromegaly.  Cardiovascular:     Rate and Rhythm: Normal rate and regular rhythm.     Heart sounds: No murmur. No friction rub. No gallop.   Pulmonary:     Effort: Pulmonary effort is normal. No respiratory distress.     Breath sounds: Normal breath sounds. No wheezing or rales.  Chest:     Breasts:        Right: No inverted nipple, mass, nipple discharge, skin change or tenderness.        Left: No inverted nipple, mass, nipple discharge, skin change or tenderness.  Abdominal:     General: Bowel sounds are normal. There is no distension.     Palpations: Abdomen is soft. There is no mass.     Tenderness: There is no abdominal tenderness. There is no guarding or rebound.  Musculoskeletal:        General: No swelling or tenderness. Normal range of motion.     Cervical back: Normal range of motion and neck supple.  Lymphadenopathy:     Cervical: No cervical adenopathy.     Lower Body: No right inguinal adenopathy. No left inguinal adenopathy.  Neurological:     General: No focal deficit present.     Mental Status: She is alert and oriented to person, place, and time.     Cranial Nerves: No cranial nerve deficit.  Skin:    General: Skin is warm and dry.     Findings: No erythema or rash.  Psychiatric:        Mood and Affect: Mood normal.        Behavior: Behavior  normal.        Judgment: Judgment normal.      Female chaperone present for pelvic and breast  portions of the physical exam  Results: AUDIT Questionnaire (screen for alcoholism): 1 PHQ-9: 0   Assessment and Plan:  59 y.o. No obstetric history on file. female here for routine annual gynecologic examination  Plan: Problem List Items Addressed This Visit    None    Visit Diagnoses    Women's annual routine gynecological examination    -  Primary   Screening for depression       Screening for alcoholism          Screening: -- Blood pressure screen normal -- Colonoscopy - not due -- Mammogram - due - already scheduled [facility] 05[facility]14/[facility] -- Weight screening: normal -- Depression screening negative (PHQ-9) -- Nutrition: normal -- cholesterol screening: per PCP -- osteoporosis screening: not due -- tobacco screening: not using -- alcohol screening: AUDIT questionnaire indicates low-risk usage. -- family history of breast cancer screening: done. not at high risk. -- no evidence of domestic violence or intimate partner violence. -- STD screening: gonorrhea/chlamydia NAAT not collected per patient request. -- pap smear not collected per ASCCP guidelines -- flu vaccine received -- HPV vaccination series: not eligilbe   Prentice Docker, MD 05/03/2019 9:30 AM

## 2019-05-14 ENCOUNTER — Telehealth (INDEPENDENT_AMBULATORY_CARE_PROVIDER_SITE_OTHER): Payer: Managed Care, Other (non HMO) | Admitting: Family Medicine

## 2019-05-14 ENCOUNTER — Encounter: Payer: Self-pay | Admitting: Family Medicine

## 2019-05-14 ENCOUNTER — Ambulatory Visit
Admission: RE | Admit: 2019-05-14 | Discharge: 2019-05-14 | Disposition: A | Discharge status: Home / Self Care | Payer: Managed Care, Other (non HMO) | Source: Ambulatory Visit | Attending: Obstetrics and Gynecology | Admitting: Obstetrics and Gynecology

## 2019-05-14 ENCOUNTER — Other Ambulatory Visit: Payer: Self-pay

## 2019-05-14 VITALS — Ht 62.0 in | Wt 142.0 lb

## 2019-05-14 DIAGNOSIS — R197 Diarrhea, unspecified: Secondary | ICD-10-CM | POA: Diagnosis not present

## 2019-05-14 DIAGNOSIS — Z1231 Encounter for screening mammogram for malignant neoplasm of breast: Secondary | ICD-10-CM | POA: Diagnosis present

## 2019-05-14 MED ORDER — CIPROFLOXACIN HCL 500 MG PO TABS: 500.0000 mg | ORAL_TABLET | Freq: Two times a day (BID) | ORAL | 0 refills | Status: DC

## 2019-05-14 NOTE — Progress Notes (Signed)
Name: Judy Butler   MRN: [id number]    DOB: 11-03-1960   Date:05/14/2019       Progress Note  Subjective  Chief Complaint  Chief Complaint  Patient presents with  . Diarrhea    Onset-2 weeks, after she gets done eating, has been alittle gassy. Not resting well took Imodium today. Denies any other symptoms    I connected with  Aura Camps  on 05/14/19 at  1:40 PM EST by telephone encounter and verified that I am speaking with the correct person using two identifiers.  I discussed the limitations of evaluation and management by telemedicine and the availability of in person appointments. The patient expressed understanding and agreed to proceed. Staff also discussed with the patient that there may be a patient responsible charge related to this service. Patient Location: in her car / going to her beach house  Provider Location: [facility] Additional Individuals present: husband   HPI  Diarrhea: she states symptoms started by itself two weeks ago. She states episodes after she eats, loose and brown, no raw food that she can see. She has been working from home, has not travelled. She had a couple of massages . She had two Pfizer COVID-19 vaccines. Last dose was almost one month ago. She denies abdominal pain, nausea, vomiting, fever or chills. She has noticed that she is getting up at night to have a bowel movement. She took an Imodium today to control symptoms. She has about 8-10 times in 24 hours and depends on frequency of her meals.    Patient Active Problem List   Diagnosis Date Noted  . Diverticulitis of colon without hemorrhage 11/08/2014  . Primary osteoarthritis involving multiple joints 11/08/2014  . Blood pressure elevated without history of HTN 11/08/2014  . Lumbosacral injury 11/08/2014  . Bilateral hip pain 11/08/2014  . Palpitations 03/29/2013  . Asymptomatic varicose veins 09/06/2009  . Migraine without aura 07/13/2008  . Osteopenia 04/14/2007     Past Surgical History:  Procedure Laterality Date  . AIKEN OSTEOTOMY Left 03/19/2018   Procedure: Barbie Banner OSTEOTOMY-(HALLUX VALGUS-DOUBLE OSTEOTOMY);  Surgeon: Albertine Patricia, DPM;  Location: [place];  Service: Podiatry;  Laterality: Left;  . BUNIONECTOMY    . COLON SURGERY  2010   sigmoid resection/rectopexy  . COLONOSCOPY  06/07/14,08/08/08,05/02/03,11/25/94  . DILATION AND CURETTAGE OF UTERUS  10/15   diagnostic/therapeutic  . ESOPHAGOGASTRODUODENOSCOPY  05/02/2003  . METATARSAL OSTEOTOMY Right 01/19/2015   Procedure: METATARSAL OSTEOTOMY DOUBLE 1ST MET & GREAT TOE PROXIMAL PHALANX;  Surgeon: Albertine Patricia, DPM;  Location: [place];  Service: Podiatry;  Laterality: Right;  LMA WITH LOCAL  . WEIL OSTEOTOMY Right 01/19/2015   Procedure: WEIL OSTEOTOMY 2ND TOE;  Surgeon: Albertine Patricia, DPM;  Location: [place];  Service: Podiatry;  Laterality: Right;    Family History  Problem Relation Age of Onset  . Arrhythmia Mother        A-Fib  . Heart failure Mother   . Hypertension Mother   . Hyperlipidemia Mother   . Colon polyps Mother   . Hypertension Father   . Heart attack Father 84       MI  . Arrhythmia Brother        A-Fib  . Heart attack Paternal Grandfather 3  . Heart disease Paternal Grandfather   . Breast cancer Neg Hx     Social History   Tobacco Use  . Smoking status: Never Smoker  . Smokeless tobacco: Never Used  Substance Use Topics  . Alcohol use: Yes    Alcohol/week: 0.0 standard drinks    Comment: Drinks wine socially./2-3 drinks per month    Current Outpatient Medications:  .  calcium-vitamin D 250-100 MG-UNIT tablet, Take 1 tablet by mouth 2 (two) times daily., Disp: 60 tablet, Rfl: 0 .  EPINEPHrine 0.3 mg/0.3 mL IJ SOAJ injection, INJECT INTRAMUSCULARLY AS DIRECTED, Disp: , Rfl:  .  famotidine (PEPCID) 40 MG tablet, TK 1 T PO BID, Disp: , Rfl:  .  Nutritional Supplements (JUICE PLUS FIBRE PO), Take by mouth., Disp: ,  Rfl:   Allergies  Allergen Reactions  . Codeine     Hives Vomiting   . Hydrocodone-Acetaminophen Nausea Only and Nausea And Vomiting  . Oxycodone Nausea Only and Nausea And Vomiting  . Oxycodone Hcl Nausea Only and Nausea And Vomiting  . Propoxyphene Nausea Only and Nausea And Vomiting  . Propoxyphene Nausea Only and Nausea And Vomiting    I personally reviewed active problem list, medication list, allergies, family history, social history with the patient/caregiver today.   ROS  Ten systems reviewed and is negative except as mentioned in HPI   Objective  Virtual encounter, vitals not obtained.  Body mass index is 25.97 kg/m.  Physical Exam  Awake, alert an oriented  PHQ2/9: Depression screen [facility] 2/9 05/14/2019 12/01/2018 12/01/2017 06/20/2015 11/08/2014  Decreased Interest 0 0 0 0 0  Down, Depressed, Hopeless 0 0 0 0 0  PHQ - 2 Score 0 0 0 0 0  Altered sleeping 0 0 - - -  Tired, decreased energy 0 0 - - -  Change in appetite 0 0 - - -  Feeling bad or failure about yourself  0 0 - - -  Trouble concentrating 0 0 - - -  Moving slowly or fidgety/restless 0 0 - - -  Suicidal thoughts 0 0 - - -  PHQ-9 Score 0 0 - - -  Difficult doing work/chores Not difficult at all Somewhat difficult - - -   PHQ-2/9 Result is negative.    Fall Risk: Fall Risk  05/14/2019 12/01/2018 12/01/2017 12/01/2017 06/20/2015  Falls in the past year? 0 0 No No No  Number falls in past yr: 0 0 - - -  Injury with Fall? 0 0 - - -     Assessment & Plan  1. Diarrhea, unspecified type  - ciprofloxacin (CIPRO) 500 MG tablet; Take 1 tablet (500 mg total) by mouth 2 (two) times daily.  Dispense: 6 tablet; Refill: 0  No red flags, but present for 2 weeks and happening at night, the patient is not home, and will not return in one week from the beach, we will treat empirically, stay hydrated, and if no resolution of symptoms with medication she will call back and return sooner for follow up  Advised to come  in for wellness exam for Tdap, hepatitis C screen, update records for flu vaccine and COVID vaccine  I discussed the assessment and treatment plan with the patient. The patient was provided an opportunity to ask questions and all were answered. The patient agreed with the plan and demonstrated an understanding of the instructions.  The patient was advised to call back or seek an in-person evaluation if the symptoms worsen or if the condition fails to improve as anticipated.  I provided 15  minutes of non-face-to-face time during this encounter.

## 2019-05-25 ENCOUNTER — Other Ambulatory Visit: Payer: Self-pay | Admitting: Family Medicine

## 2019-05-25 ENCOUNTER — Telehealth: Payer: Self-pay | Admitting: Family Medicine

## 2019-05-25 DIAGNOSIS — R197 Diarrhea, unspecified: Secondary | ICD-10-CM

## 2019-05-25 DIAGNOSIS — K449 Diaphragmatic hernia without obstruction or gangrene: Secondary | ICD-10-CM

## 2019-05-25 NOTE — Telephone Encounter (Signed)
Pt needs a refill to help her last until her next appt. Please advise

## 2019-05-25 NOTE — Telephone Encounter (Signed)
Medication Refill - Medication: ciprofloxacin (CIPRO) 500 MG tablet  Pt would like to know if Dr. Carlynn Purl would refill Cipro. Stated her symptoms subsided but then returned over the weekend. Please let pt know if Dr. Carlynn Purl declines refill.  Has the patient contacted their pharmacy? No. (Agent: If no, request that the patient contact the pharmacy for the refill.) (Agent: If yes, when and what did the pharmacy advise?)  Preferred Pharmacy (with phone number or street name):  [id number] - [place], [place] - [address] & Phone:  [phone]  Fax:  [phone]       Agent: Please be advised that RX refills may take up to 3 business days. We ask that you follow-up with your pharmacy.

## 2019-05-25 NOTE — Telephone Encounter (Signed)
Requested medication (s) are due for refill today:   Provider's decision  Requested medication (s) are on the active medication list:   Yes  Future visit scheduled:   Yes in 2 weeks   Last ordered: 05/14/2019  6 tablets  Clinic note:   Returned because no protocol for this medication.   Requested Prescriptions  Pending Prescriptions Disp Refills   ciprofloxacin (CIPRO) 500 MG tablet 6 tablet 0    Sig: Take 1 tablet (500 mg total) by mouth 2 (two) times daily.      Off-Protocol Failed - 05/25/2019  9:02 AM      Failed - Medication not assigned to a protocol, review manually.      Passed - Valid encounter within last 12 months    Recent Outpatient Visits           1 week ago Diarrhea, unspecified type   [facility] Alba Cory, MD   5 months ago Hiatal hernia   [facility] Alba Cory, MD   1 year ago Eustachian tube dysfunction, right   [facility] Cheryle Horsfall, NP   3 years ago Upper respiratory infection   [facility] Alba Cory, MD   3 years ago Tick bite of back, initial encounter   [facility] Ellyn Hack, MD       Future Appointments             In 2 weeks Alba Cory, MD [facility], [facility]

## 2019-05-26 ENCOUNTER — Other Ambulatory Visit: Payer: Self-pay | Admitting: Family Medicine

## 2019-05-26 DIAGNOSIS — R197 Diarrhea, unspecified: Secondary | ICD-10-CM

## 2019-05-26 NOTE — Telephone Encounter (Signed)
She would like to go to GI. She can not wait until April 7 to get relief from her symptoms. She needs an appointment before April 7. Cipro stopped diarrhea for 3 days and it made her feel better. Please advise.

## 2019-05-27 MED ORDER — DICYCLOMINE HCL 10 MG PO CAPS: 10.0000 mg | ORAL_CAPSULE | Freq: Three times a day (TID) | ORAL | 0 refills | Status: AC

## 2019-05-27 NOTE — Addendum Note (Signed)
Addended by: Alba Cory F on: 05/27/2019 12:49 PM   Modules accepted: Orders

## 2019-05-27 NOTE — Telephone Encounter (Signed)
She already has an appointment for the week after she sees you. She called and made appointment herself. She still wants to know if you can send her something for relief, if not the antibiotic she needs something.

## 2019-05-27 NOTE — Telephone Encounter (Signed)
Patient called.  Patient aware.  

## 2019-06-09 ENCOUNTER — Encounter: Payer: Self-pay | Admitting: Family Medicine

## 2019-06-09 ENCOUNTER — Other Ambulatory Visit: Payer: Self-pay

## 2019-06-09 ENCOUNTER — Ambulatory Visit: Payer: Managed Care, Other (non HMO) | Admitting: Family Medicine

## 2019-06-09 VITALS — BP 118/72 | HR 87 | Temp 97.5°F | Resp 16 | Ht 62.0 in | Wt 143.0 lb

## 2019-06-09 DIAGNOSIS — R197 Diarrhea, unspecified: Secondary | ICD-10-CM | POA: Diagnosis not present

## 2019-06-09 DIAGNOSIS — Z23 Encounter for immunization: Secondary | ICD-10-CM | POA: Diagnosis not present

## 2019-06-09 DIAGNOSIS — S61211A Laceration without foreign body of left index finger without damage to nail, initial encounter: Secondary | ICD-10-CM

## 2019-06-09 MED ORDER — AMOXICILLIN 875 MG PO TABS: 875.0000 mg | ORAL_TABLET | Freq: Two times a day (BID) | ORAL | 0 refills | Status: AC

## 2019-06-09 NOTE — Progress Notes (Signed)
Name: Judy Butler   MRN: [id number]    DOB: 03/09/1960   Date:06/09/2019       Progress Note  Subjective  Chief Complaint  Chief Complaint  Patient presents with  . Follow-up  . Diarrhea    HPI  Laceration her left ring finger: it happened yesterday, she was gardening and cut her finger yesterday 06/08/2019 . It is still oozing blood . Some bruising . She washed the area immediately, it was a large piece of glass, she has not been able to remove her ring but it moves freely over her skin   Diarrhea: doing better with Bentyl still has watery stools but sleeping through the night and no longer having to have a bowel movement right after she eats, taking about one hour. She has an appointment next week with GI, she is happy that she is feeling better   Patient Active Problem List   Diagnosis Date Noted  . Diverticulitis of colon without hemorrhage 11/08/2014  . Primary osteoarthritis involving multiple joints 11/08/2014  . Blood pressure elevated without history of HTN 11/08/2014  . Lumbosacral injury 11/08/2014  . Bilateral hip pain 11/08/2014  . Palpitations 03/29/2013  . Asymptomatic varicose veins 09/06/2009  . Migraine without aura 07/13/2008  . Osteopenia 04/14/2007    Past Surgical History:  Procedure Laterality Date  . AIKEN OSTEOTOMY Left 03/19/2018   Procedure: Barbie Banner OSTEOTOMY-(HALLUX VALGUS-DOUBLE OSTEOTOMY);  Surgeon: Albertine Patricia, DPM;  Location: [place];  Service: Podiatry;  Laterality: Left;  . BUNIONECTOMY    . COLON SURGERY  2010   sigmoid resection/rectopexy  . COLONOSCOPY  06/07/14,08/08/08,05/02/03,11/25/94  . DILATION AND CURETTAGE OF UTERUS  10/15   diagnostic/therapeutic  . ESOPHAGOGASTRODUODENOSCOPY  05/02/2003  . METATARSAL OSTEOTOMY Right 01/19/2015   Procedure: METATARSAL OSTEOTOMY DOUBLE 1ST MET & GREAT TOE PROXIMAL PHALANX;  Surgeon: Albertine Patricia, DPM;  Location: [place];  Service: Podiatry;  Laterality: Right;  LMA WITH  LOCAL  . WEIL OSTEOTOMY Right 01/19/2015   Procedure: WEIL OSTEOTOMY 2ND TOE;  Surgeon: Albertine Patricia, DPM;  Location: [place];  Service: Podiatry;  Laterality: Right;    Family History  Problem Relation Age of Onset  . Arrhythmia Mother        A-Fib  . Heart failure Mother   . Hypertension Mother   . Hyperlipidemia Mother   . Colon polyps Mother   . Hypertension Father   . Heart attack Father 62       MI  . Arrhythmia Brother        A-Fib  . Heart attack Paternal Grandfather 75  . Heart disease Paternal Grandfather   . Breast cancer Neg Hx     Social History   Tobacco Use  . Smoking status: Never Smoker  . Smokeless tobacco: Never Used  Substance Use Topics  . Alcohol use: Yes    Alcohol/week: 0.0 standard drinks    Comment: Drinks wine socially./2-3 drinks per month     Current Outpatient Medications:  .  calcium-vitamin D 250-100 MG-UNIT tablet, Take 1 tablet by mouth 2 (two) times daily., Disp: 60 tablet, Rfl: 0 .  colestipol (COLESTID) 1 g tablet, Take 4 g by mouth daily., Disp: , Rfl:  .  dicyclomine (BENTYL) 10 MG capsule, Take 1 capsule (10 mg total) by mouth 4 (four) times daily -  before meals and at bedtime., Disp: 40 capsule, Rfl: 0 .  EPINEPHrine 0.3 mg/0.3 mL IJ SOAJ injection, INJECT INTRAMUSCULARLY AS DIRECTED, Disp: , Rfl:  .  famotidine (PEPCID) 40 MG tablet, TK 1 T PO BID, Disp: , Rfl:  .  Nutritional Supplements (JUICE PLUS FIBRE PO), Take by mouth., Disp: , Rfl:  .  ciprofloxacin (CIPRO) 500 MG tablet, Take 1 tablet (500 mg total) by mouth 2 (two) times daily. (Patient not taking: Reported on 06/09/2019), Disp: 6 tablet, Rfl: 0  Allergies  Allergen Reactions  . Codeine     Hives Vomiting   . Hydrocodone-Acetaminophen Nausea Only and Nausea And Vomiting  . Oxycodone Hcl Nausea Only and Nausea And Vomiting  . Propoxyphene Nausea Only and Nausea And Vomiting    I personally reviewed active problem list, medication list, allergies,  family history with the patient/caregiver today.   ROS  Ten systems reviewed and is negative except as mentioned in HPI   Objective  Vitals:   06/09/19 1426  BP: 118/72  Pulse: 87  Resp: 16  Temp: (!) 97.5 F (36.4 C)  TempSrc: Temporal  SpO2: 95%  Weight: 143 lb (64.9 kg)  Height: _0  (1.575 m)    Body mass index is 26.16 kg/m.  Physical Exam  Constitutional: Patient appears well-developed and well-nourished. No distress.  HEENT: head atraumatic, normocephalic Cardiovascular: Normal rate, regular rhythm and normal heart sounds.  No murmur heard. No BLE edema. Pulmonary/Chest: Effort normal and breath sounds normal. No respiratory distress. Psychiatric: Patient has a normal mood and affect. behavior is normal. Judgment and thought content normal. Skin: flap laceration on palmar surface of ring finger left hand, slow blood oozing, mild swelling but good perfusion of finger, and ring is not causing constriction, explained that she will need to go to [facility] if finger swells more   PHQ2/9: Depression screen [facility] 2/9 06/09/2019 05/14/2019 12/01/2018 12/01/2017 06/20/2015  Decreased Interest 0 0 0 0 0  Down, Depressed, Hopeless 0 0 0 0 0  PHQ - 2 Score 0 0 0 0 0  Altered sleeping 0 0 0 - -  Tired, decreased energy 0 0 0 - -  Change in appetite 0 0 0 - -  Feeling bad or failure about yourself  0 0 0 - -  Trouble concentrating 0 0 0 - -  Moving slowly or fidgety/restless 0 0 0 - -  Suicidal thoughts 0 0 0 - -  PHQ-9 Score 0 0 0 - -  Difficult doing work/chores Not difficult at all Not difficult at all Somewhat difficult - -    phq 9 is negative   Fall Risk: Fall Risk  06/09/2019 05/14/2019 12/01/2018 12/01/2017 12/01/2017  Falls in the past year? 0 0 0 No No  Number falls in past yr: 0 0 0 - -  Injury with Fall? 0 0 0 - -     Functional Status Survey: Is the patient deaf or have difficulty hearing?: No Does the patient have difficulty seeing, even when wearing  glasses/contacts?: No Does the patient have difficulty concentrating, remembering, or making decisions?: No Does the patient have difficulty walking or climbing stairs?: No Does the patient have difficulty dressing or bathing?: No Does the patient have difficulty doing errands alone such as visiting a doctor's office or shopping?: No    Assessment & Plan  1. Need for diphtheria-tetanus-pertussis (Tdap) vaccine  - Tdap vaccine greater than or equal to 7yo IM  2. Diarrhea, unspecified type  Keep follow up with GI   3. Laceration of left index finger without foreign body without damage to nail, initial encounter  May hold off on getting antibiotics  -  amoxicillin (AMOXIL) 875 MG tablet; Take 1 tablet (875 mg total) by mouth 2 (two) times daily.  Dispense: 14 tablet; Refill: 0

## 2019-09-02 ENCOUNTER — Encounter: Payer: Self-pay | Admitting: Family Medicine

## 2020-01-14 ENCOUNTER — Other Ambulatory Visit: Payer: Self-pay | Admitting: Orthopedic Surgery

## 2020-01-14 ENCOUNTER — Other Ambulatory Visit (HOSPITAL_COMMUNITY): Payer: Self-pay | Admitting: Orthopedic Surgery

## 2020-01-14 DIAGNOSIS — M542 Cervicalgia: Secondary | ICD-10-CM

## 2020-01-14 DIAGNOSIS — M25561 Pain in right knee: Secondary | ICD-10-CM

## 2020-01-18 ENCOUNTER — Ambulatory Visit
Admission: RE | Admit: 2020-01-18 | Discharge: 2020-01-18 | Disposition: A | Discharge status: Home / Self Care | Payer: BC Managed Care – PPO | Source: Ambulatory Visit | Attending: Orthopedic Surgery | Admitting: Orthopedic Surgery

## 2020-01-18 ENCOUNTER — Other Ambulatory Visit: Payer: Self-pay

## 2020-01-18 DIAGNOSIS — M25561 Pain in right knee: Secondary | ICD-10-CM | POA: Insufficient documentation

## 2020-01-18 DIAGNOSIS — M542 Cervicalgia: Secondary | ICD-10-CM | POA: Diagnosis present

## 2020-04-26 ENCOUNTER — Other Ambulatory Visit: Payer: Self-pay | Admitting: Internal Medicine

## 2020-04-26 ENCOUNTER — Other Ambulatory Visit: Payer: Self-pay | Admitting: Obstetrics and Gynecology

## 2020-04-26 DIAGNOSIS — Z1231 Encounter for screening mammogram for malignant neoplasm of breast: Secondary | ICD-10-CM

## 2020-06-13 ENCOUNTER — Ambulatory Visit
Admission: RE | Admit: 2020-06-13 | Discharge: 2020-06-13 | Disposition: A | Discharge status: Home / Self Care | Payer: BC Managed Care – PPO | Source: Ambulatory Visit | Attending: Internal Medicine | Admitting: Internal Medicine

## 2020-06-13 ENCOUNTER — Other Ambulatory Visit: Payer: Self-pay

## 2020-06-13 DIAGNOSIS — Z1231 Encounter for screening mammogram for malignant neoplasm of breast: Secondary | ICD-10-CM | POA: Diagnosis present

## 2020-06-27 ENCOUNTER — Encounter: Payer: Self-pay | Admitting: Obstetrics and Gynecology

## 2020-06-27 ENCOUNTER — Other Ambulatory Visit: Payer: Self-pay

## 2020-06-27 ENCOUNTER — Ambulatory Visit (INDEPENDENT_AMBULATORY_CARE_PROVIDER_SITE_OTHER): Payer: BC Managed Care – PPO | Admitting: Obstetrics and Gynecology

## 2020-06-27 VITALS — BP 124/78 | Ht 62.0 in | Wt 142.0 lb

## 2020-06-27 DIAGNOSIS — Z1339 Encounter for screening examination for other mental health and behavioral disorders: Secondary | ICD-10-CM | POA: Diagnosis not present

## 2020-06-27 DIAGNOSIS — Z01419 Encounter for gynecological examination (general) (routine) without abnormal findings: Secondary | ICD-10-CM | POA: Diagnosis not present

## 2020-06-27 DIAGNOSIS — Z1331 Encounter for screening for depression: Secondary | ICD-10-CM | POA: Diagnosis not present

## 2020-06-27 NOTE — Progress Notes (Signed)
Routine Annual Gynecology Examination   PCP: Baxter Hire, MD  Chief Complaint  Patient presents with  . Annual Exam    History of Present Illness: Patient is a 60 y.o. G0 presents for annual exam. The patient has no complaints today.   Menopausal bleeding: denies  Menopausal symptoms: denies  Breast symptoms: denies  Last pap smear: 2 years ago.  Result Normal, HPV negative  Last mammogram: 2 weeks ago.  Result Normal   Last colonoscopy: up to date, 2021 - follow up, a collagenous colitis found that can be only found on biopsy.  Past Medical History:  Diagnosis Date  . Asymptomatic varicose veins   . Bronchitis   . Bunion of right foot   . Collagenous colitis 2021  . Complication of anesthesia    stopped breathing in recovery room from morphine  . Diverticulosis of colon (without mention of hemorrhage) 2010  . Headache(784.0)   . Osteoarthrosis, unspecified whether generalized or localized, unspecified site    hands  . Osteoporosis, unspecified   . Palpitations   . PONV (postoperative nausea and vomiting)   . Post-menopausal   . Post-nasal drainage     Past Surgical History:  Procedure Laterality Date  . AIKEN OSTEOTOMY Left 03/19/2018   Procedure: Barbie Banner OSTEOTOMY-(HALLUX VALGUS-DOUBLE OSTEOTOMY);  Surgeon: Albertine Patricia, DPM;  Location: [place];  Service: Podiatry;  Laterality: Left;  . BUNIONECTOMY    . COLON SURGERY  2010   sigmoid resection/rectopexy  . COLONOSCOPY  06/07/14,08/08/08,05/02/03,11/25/94  . DILATION AND CURETTAGE OF UTERUS  10/15   diagnostic/therapeutic  . ESOPHAGOGASTRODUODENOSCOPY  05/02/2003  . METATARSAL OSTEOTOMY Right 01/19/2015   Procedure: METATARSAL OSTEOTOMY DOUBLE 1ST MET & GREAT TOE PROXIMAL PHALANX;  Surgeon: Albertine Patricia, DPM;  Location: [place];  Service: Podiatry;  Laterality: Right;  LMA WITH LOCAL  . WEIL OSTEOTOMY Right 01/19/2015   Procedure: WEIL OSTEOTOMY 2ND TOE;  Surgeon: Albertine Patricia, DPM;  Location: [place];  Service: Podiatry;  Laterality: Right;    Prior to Admission medications   Medication Sig Start Date End Date Taking? Authorizing Provider  calcium-vitamin D 250-100 MG-UNIT tablet Take 1 tablet by mouth 2 (two) times daily. 08/15/15  Yes Sowles, Drue Stager, MD  EPINEPHrine 0.3 mg/0.3 mL IJ SOAJ injection INJECT INTRAMUSCULARLY AS DIRECTED 10/21/18  Yes [provider]  Nutritional Supplements (JUICE PLUS FIBRE PO) Take by mouth.   Yes [provider]  pantoprazole (PROTONIX) 40 MG tablet Take 1 tablet (40 mg total) by mouth daily. 12/01/18 01/30/19  Steele Sizer, MD    Allergies  Allergen Reactions  . Codeine     Hives Vomiting   . Hydrocodone-Acetaminophen Nausea Only and Nausea And Vomiting  . Oxycodone Hcl Nausea Only and Nausea And Vomiting  . Propoxyphene Nausea Only and Nausea And Vomiting    Obstetric History: G0  Social History   Socioeconomic History  . Marital status: Married    Spouse name: Not on file  . Number of children: 0  . Years of education: Not on file  . Highest education level: Bachelor's degree (e.g., BA, AB, BS)  Occupational History  . Occupation: VP of HR     Comment: human resources   Tobacco Use  . Smoking status: Never Smoker  . Smokeless tobacco: Never Used  Vaping Use  . Vaping Use: Never used  Substance and Sexual Activity  . Alcohol use: Yes    Alcohol/week: 0.0 standard drinks    Comment: Drinks wine socially./2-3  drinks per month  . Drug use: No  . Sexual activity: Yes    Partners: Male    Birth control/protection: Post-menopausal  Other Topics Concern  . Not on file  Social History Narrative   Retired at age 52 from [employer] [place] April 2021    Social Determinants of Health   Financial Resource Strain: Not on file  Food Insecurity: Not on file  Transportation Needs: Not on file  Physical Activity: Not on file  Stress: Not on file  Social Connections:  Not on file  Intimate Partner Violence: Not on file    Family History  Problem Relation Age of Onset  . Arrhythmia Mother        A-Fib  . Heart failure Mother   . Hypertension Mother   . Hyperlipidemia Mother   . Colon polyps Mother   . Hypertension Father   . Heart attack Father 31       MI  . Arrhythmia Brother        A-Fib  . Heart attack Paternal Grandfather 80  . Heart disease Paternal Grandfather   . Breast cancer Neg Hx     Review of Systems  Constitutional: Negative.   HENT: Negative.   Eyes: Negative.   Respiratory: Negative.   Cardiovascular: Negative.   Gastrointestinal: Negative.   Genitourinary: Negative.   Musculoskeletal: Negative.   Skin: Negative.   Neurological: Negative.   Psychiatric/Behavioral: Negative.      Physical Exam Vitals: BP 124/78   Ht 5' 2"  (1.575 m)   Wt 142 lb (64.4 kg)   BMI 25.97 kg/m   Physical Exam Constitutional:      General: She is not in acute distress.    Appearance: Normal appearance. She is well-developed.  Genitourinary:     Vulva and bladder normal.     No vaginal discharge, erythema, tenderness or bleeding.      Right Adnexa: not tender, not full and no mass present.    Left Adnexa: not tender, not full and no mass present.    No cervical motion tenderness, discharge, lesion or polyp.     Uterus is not enlarged or tender.     No uterine mass detected.    Pelvic exam was performed with patient in the lithotomy position.  Breasts:     Right: No inverted nipple, mass, nipple discharge, skin change or tenderness.     Left: No inverted nipple, mass, nipple discharge, skin change or tenderness.    HENT:     Head: Normocephalic and atraumatic.  Eyes:     General: No scleral icterus.    Conjunctiva/sclera: Conjunctivae normal.  Neck:     Thyroid: No thyromegaly.  Cardiovascular:     Rate and Rhythm: Normal rate and regular rhythm.     Heart sounds: No murmur heard. No friction rub. No gallop.   Pulmonary:      Effort: Pulmonary effort is normal. No respiratory distress.     Breath sounds: Normal breath sounds. No wheezing or rales.  Abdominal:     General: Bowel sounds are normal. There is no distension.     Palpations: Abdomen is soft. There is no mass.     Tenderness: There is no abdominal tenderness. There is no guarding or rebound.  Musculoskeletal:        General: No swelling or tenderness. Normal range of motion.     Cervical back: Normal range of motion and neck supple.  Lymphadenopathy:     Cervical:  No cervical adenopathy.     Lower Body: No right inguinal adenopathy. No left inguinal adenopathy.  Neurological:     General: No focal deficit present.     Mental Status: She is alert and oriented to person, place, and time.     Cranial Nerves: No cranial nerve deficit.  Skin:    General: Skin is warm and dry.     Findings: No erythema or rash.  Psychiatric:        Mood and Affect: Mood normal.        Behavior: Behavior normal.        Judgment: Judgment normal.      Female chaperone present for pelvic and breast  portions of the physical exam  Results: AUDIT Questionnaire (screen for alcoholism): 3 PHQ-9: 0   Assessment and Plan:  60 y.o. No obstetric history on file. female here for routine annual gynecologic examination  Plan: Problem List Items Addressed This Visit   None   Visit Diagnoses    Women's annual routine gynecological examination    -  Primary   Screening for depression       Screening for alcoholism          Screening: -- Blood pressure screen normal -- Colonoscopy - not due -- Mammogram - due - already scheduled at [facility] on 05/14/2019 -- Weight screening: normal -- Depression screening negative (PHQ-9) -- Nutrition: normal -- cholesterol screening: per PCP -- osteoporosis screening: not due -- tobacco screening: not using -- alcohol screening: AUDIT questionnaire indicates low-risk usage. -- family history of breast cancer screening:  done. not at high risk. -- no evidence of domestic violence or intimate partner violence. -- STD screening: gonorrhea/chlamydia NAAT not collected per patient request. -- pap smear not collected per ASCCP guidelines -- flu vaccine received -- HPV vaccination series: not eligilbe   Prentice Docker, MD 06/27/2020 8:49 AM

## 2021-03-11 IMAGING — CR DG CHEST 2V
2 series · 2 of 2 positions shown · non-contrast
Comparison: 08/21/2017

CLINICAL DATA: Chest pain, hypertension

EXAM:
CHEST - 2 VIEW

[chest pa]
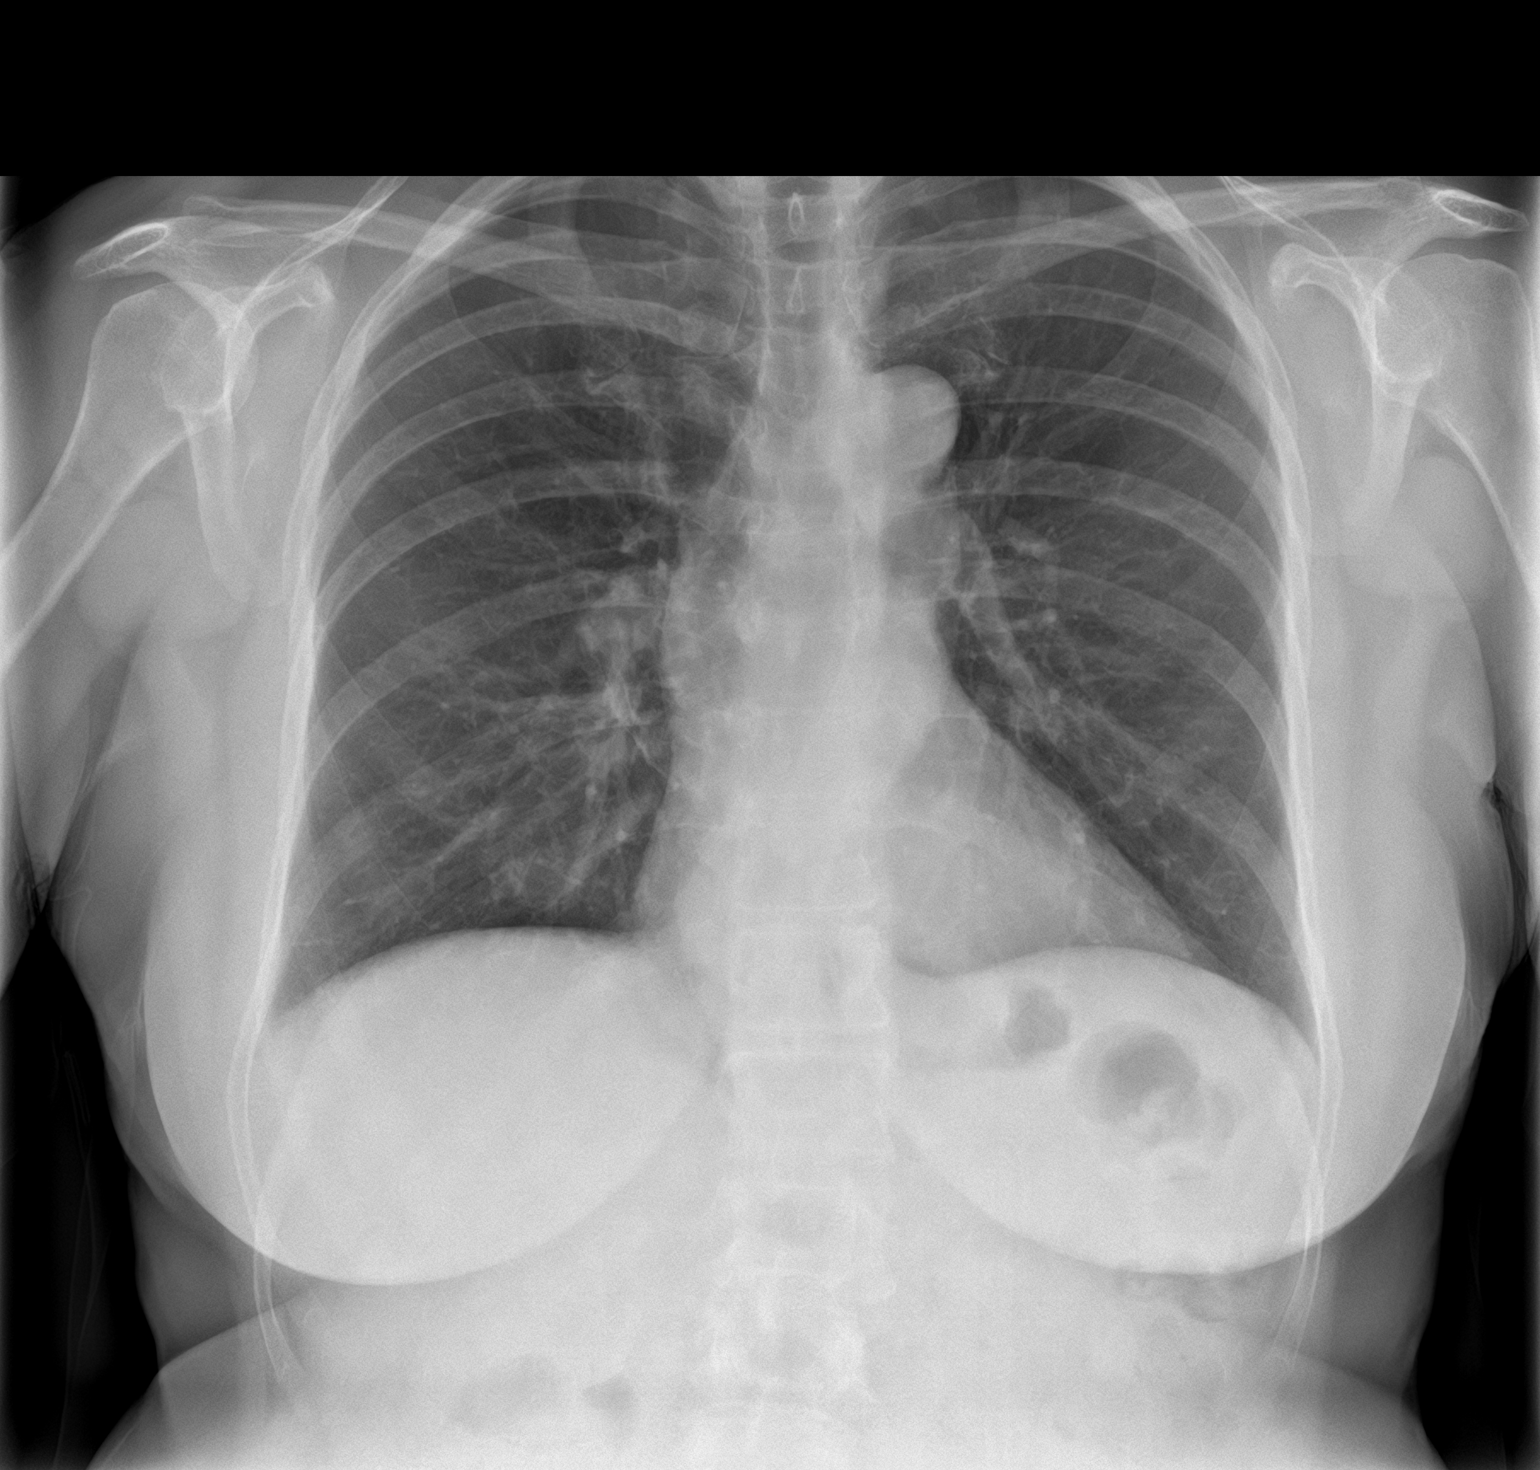

[chest lat]
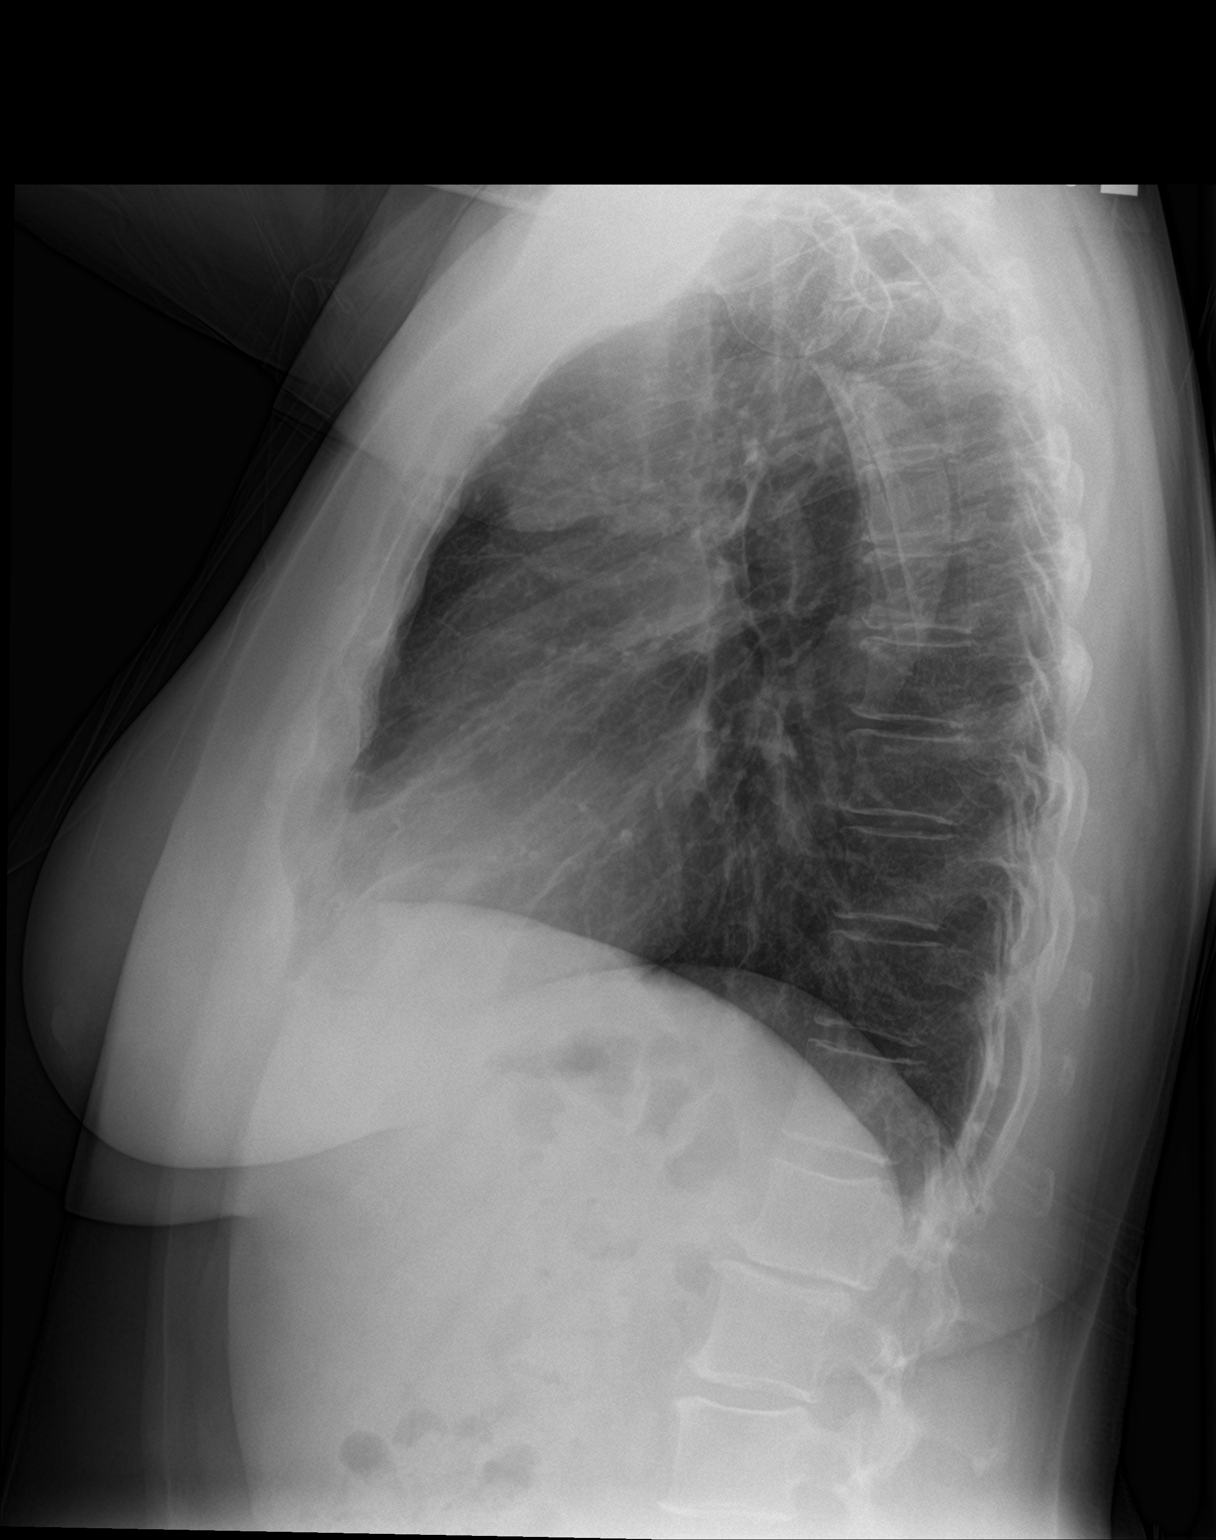

[2 of 2 positions shown; findings below may reference images not displayed]

FINDINGS: The heart size and mediastinal contours are within normal limits.
Both lungs are clear. The visualized skeletal structures are
unremarkable.
IMPRESSION: No active cardiopulmonary disease.

## 2021-07-10 ENCOUNTER — Other Ambulatory Visit: Payer: Self-pay | Admitting: Obstetrics and Gynecology

## 2021-07-10 DIAGNOSIS — Z1231 Encounter for screening mammogram for malignant neoplasm of breast: Secondary | ICD-10-CM

## 2021-08-07 ENCOUNTER — Ambulatory Visit
Admission: RE | Admit: 2021-08-07 | Discharge: 2021-08-07 | Disposition: A | Discharge status: Home / Self Care | Payer: BC Managed Care – PPO | Source: Ambulatory Visit | Attending: Obstetrics and Gynecology | Admitting: Obstetrics and Gynecology

## 2021-08-07 DIAGNOSIS — Z1231 Encounter for screening mammogram for malignant neoplasm of breast: Secondary | ICD-10-CM | POA: Diagnosis present

## 2021-09-05 IMAGING — MG DIGITAL SCREENING BILAT W/ TOMO W/ CAD
8 series · 8 of 24 positions shown · non-contrast
Comparison: Previous exam(s).

CLINICAL DATA: Screening.

EXAM:
DIGITAL SCREENING BILATERAL MAMMOGRAM WITH TOMO AND CAD

[R MLO synth-2D]
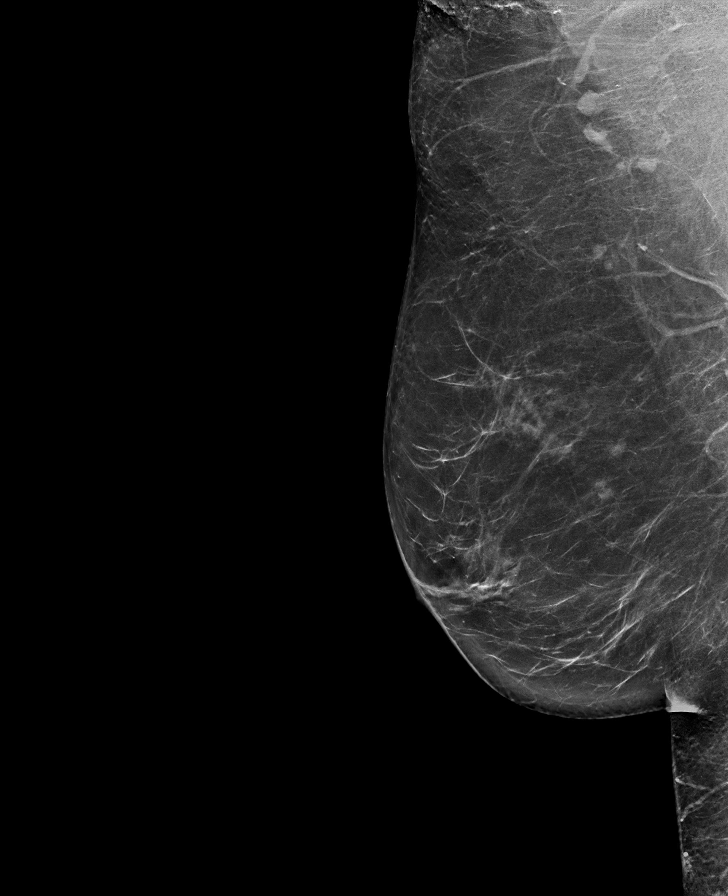

[L CC synth-2D]
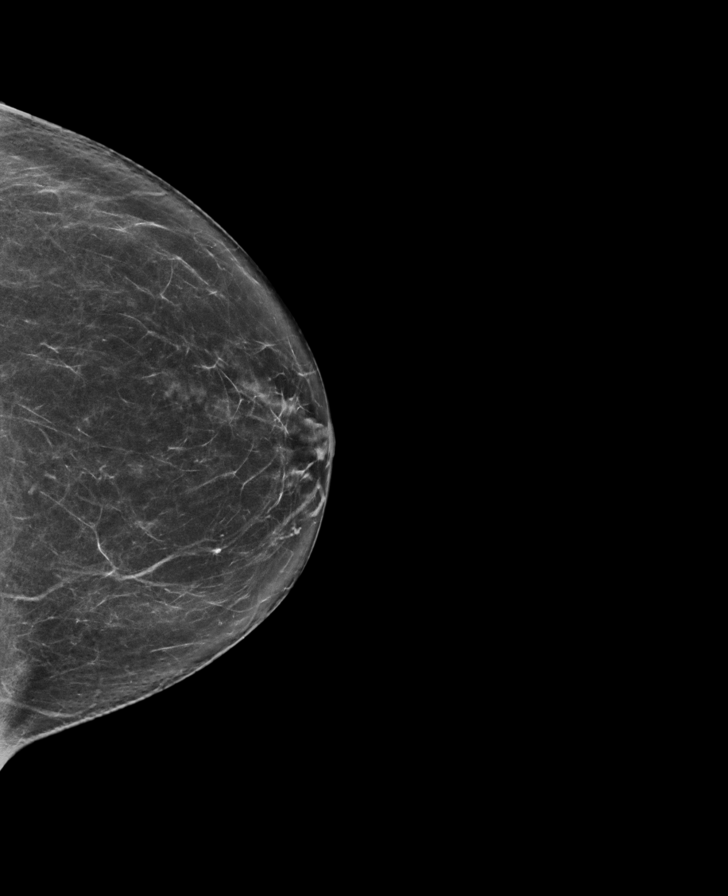

[L MLO synth-2D]
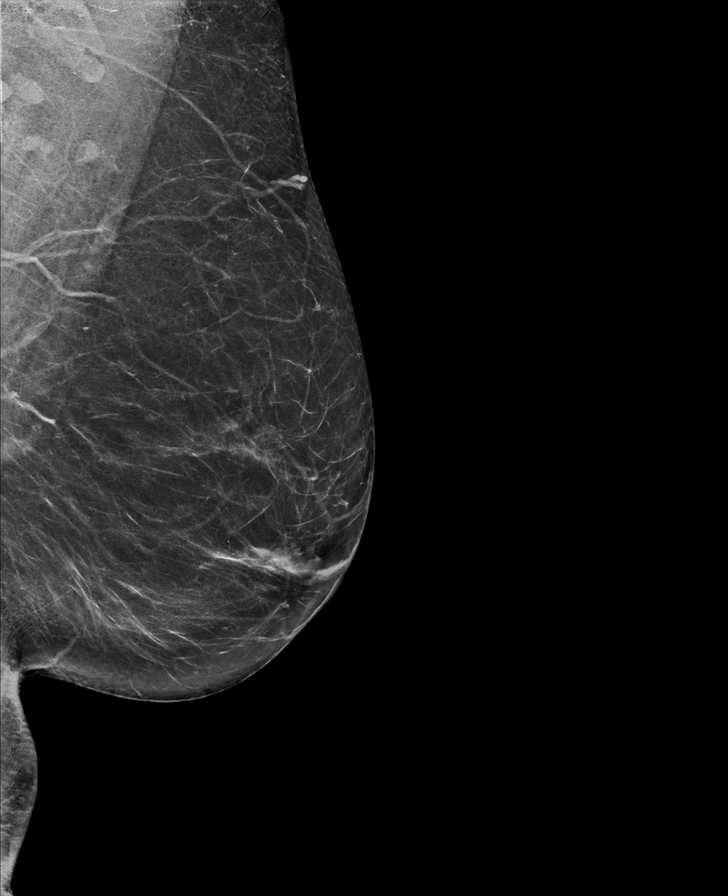

[R CC synth-2D]
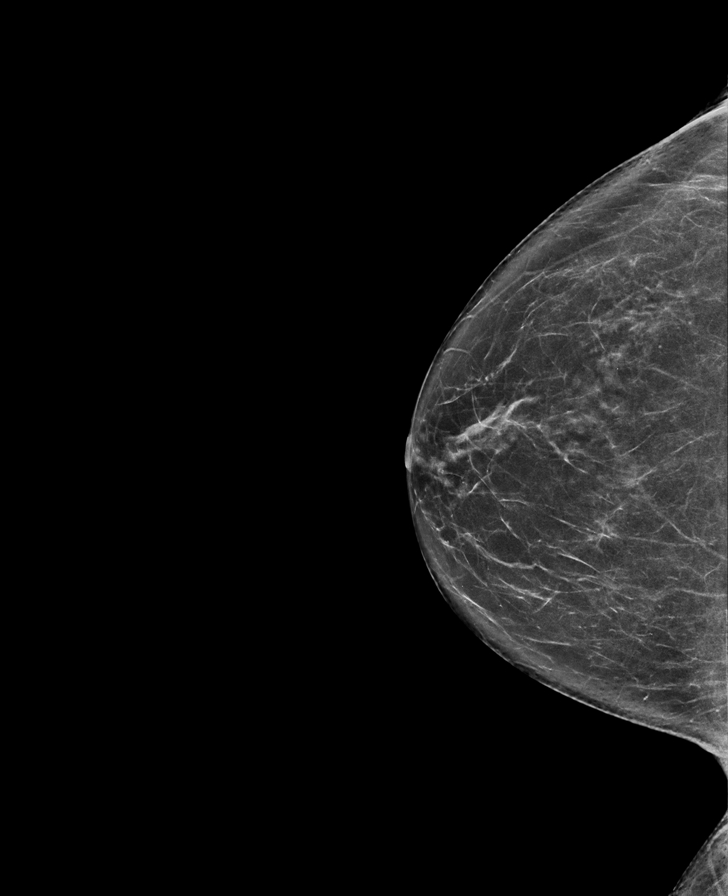

[L MLO tomo · tomo slice 35/69.0]
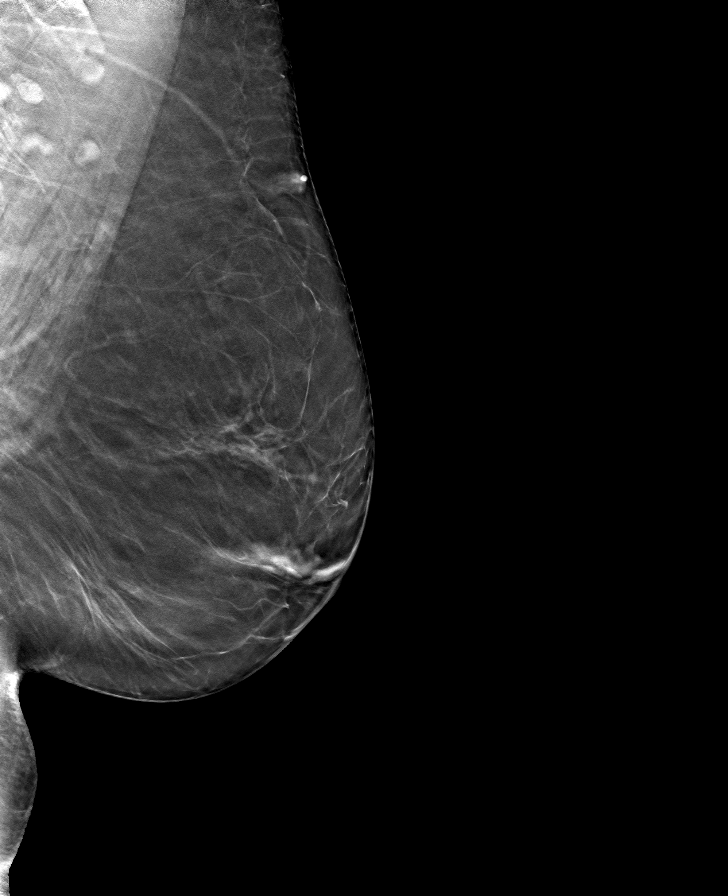

[L CC tomo · tomo slice 36/71.0]
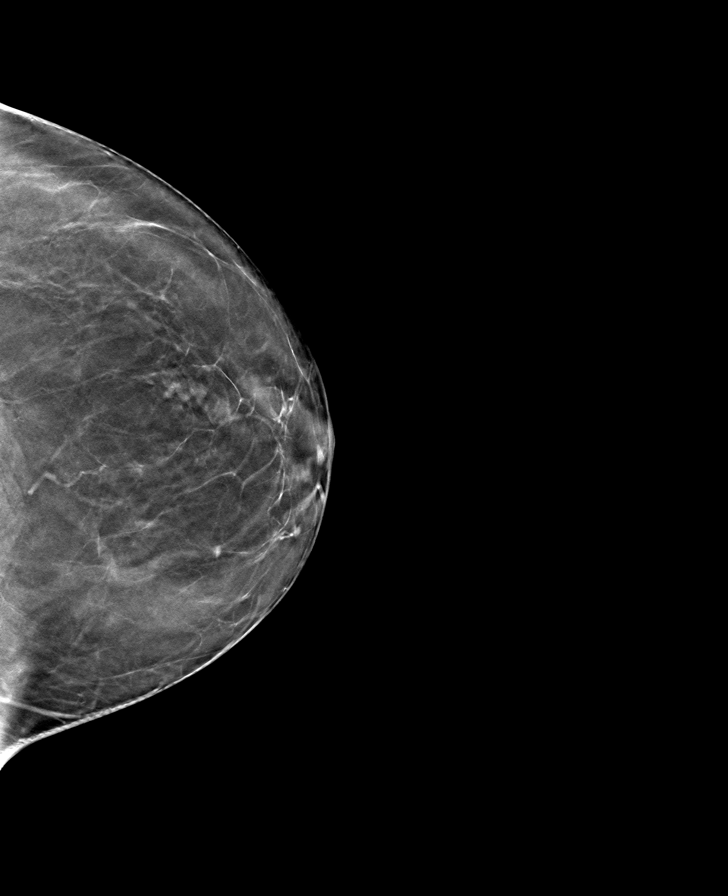

[R MLO tomo · tomo slice 43/86.0]
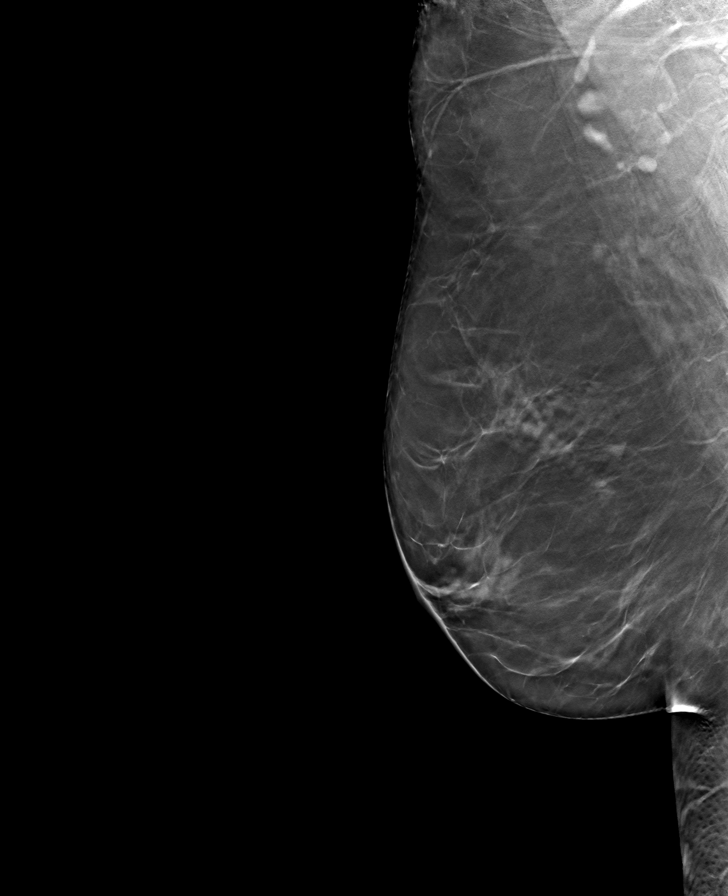

[R CC tomo · tomo slice 35/70.0]
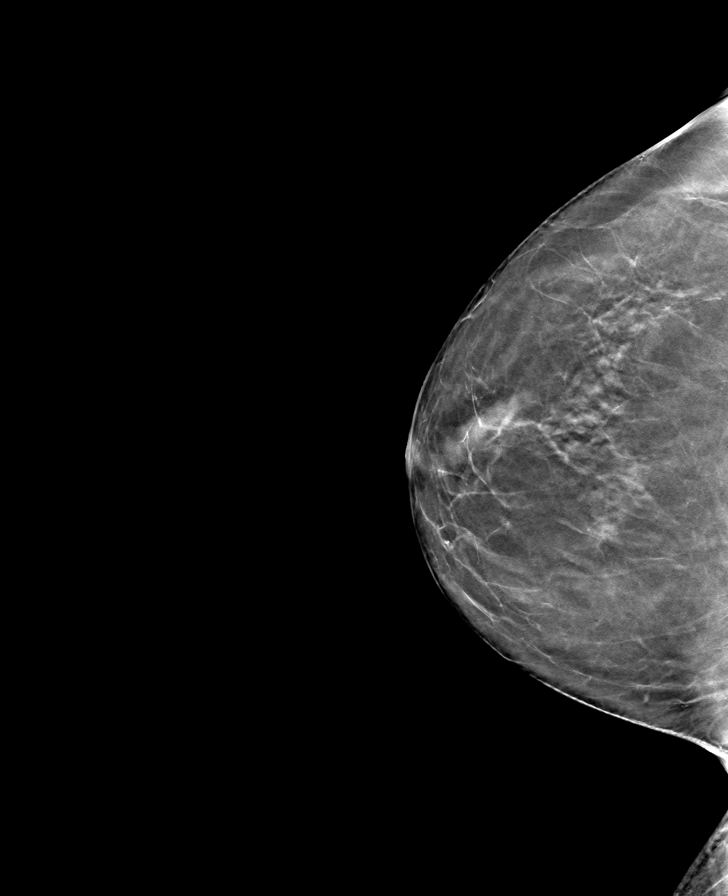

[8 of 24 positions shown; findings below may reference images not displayed]

ACR Breast Density Category b: There are scattered areas of
fibroglandular density.
FINDINGS: There are no findings suspicious for malignancy. Images were
processed with CAD.
IMPRESSION: No mammographic evidence of malignancy. A result letter of this
screening mammogram will be mailed directly to the patient.

RECOMMENDATION:
Screening mammogram in one year. (Code:[id number])

BI-RADS CATEGORY  1: Negative.

## 2022-08-20 ENCOUNTER — Other Ambulatory Visit: Payer: Self-pay | Admitting: Obstetrics and Gynecology

## 2022-08-20 DIAGNOSIS — Z1231 Encounter for screening mammogram for malignant neoplasm of breast: Secondary | ICD-10-CM

## 2022-09-19 ENCOUNTER — Ambulatory Visit
Admission: RE | Admit: 2022-09-19 | Discharge: 2022-09-19 | Disposition: A | Discharge status: Home / Self Care | Payer: BC Managed Care – PPO | Source: Ambulatory Visit | Attending: Obstetrics and Gynecology | Admitting: Obstetrics and Gynecology

## 2022-09-19 DIAGNOSIS — Z1231 Encounter for screening mammogram for malignant neoplasm of breast: Secondary | ICD-10-CM | POA: Insufficient documentation

## 2022-11-26 ENCOUNTER — Other Ambulatory Visit (INDEPENDENT_AMBULATORY_CARE_PROVIDER_SITE_OTHER): Payer: Self-pay | Admitting: Nurse Practitioner

## 2022-11-26 DIAGNOSIS — I83812 Varicose veins of left lower extremities with pain: Secondary | ICD-10-CM

## 2022-11-28 ENCOUNTER — Encounter (INDEPENDENT_AMBULATORY_CARE_PROVIDER_SITE_OTHER): Payer: Self-pay | Admitting: Nurse Practitioner

## 2022-11-28 ENCOUNTER — Ambulatory Visit (INDEPENDENT_AMBULATORY_CARE_PROVIDER_SITE_OTHER): Payer: BC Managed Care – PPO

## 2022-11-28 ENCOUNTER — Ambulatory Visit (INDEPENDENT_AMBULATORY_CARE_PROVIDER_SITE_OTHER): Payer: BC Managed Care – PPO | Admitting: Nurse Practitioner

## 2022-11-28 DIAGNOSIS — I83819 Varicose veins of unspecified lower extremities with pain: Secondary | ICD-10-CM | POA: Diagnosis not present

## 2022-11-28 DIAGNOSIS — I83812 Varicose veins of left lower extremities with pain: Secondary | ICD-10-CM | POA: Diagnosis not present

## 2022-12-02 ENCOUNTER — Encounter (INDEPENDENT_AMBULATORY_CARE_PROVIDER_SITE_OTHER): Payer: Self-pay | Admitting: Nurse Practitioner

## 2022-12-02 NOTE — Progress Notes (Signed)
Subjective:    Patient ID: Judy Butler, female    DOB: 1960-05-21, 62 y.o.   MRN: [id number] Chief Complaint  Patient presents with   New Patient (Initial Visit)    Ref Letitia Libra consult lle varicose veins with pain    The patient is seen for evaluation of symptomatic varicose veins.  The patient also notes an aching and throbbing pain over the varicosities, particularly with prolonged dependent positions. The symptoms are significantly improved with elevation. The patient states the pain from the varicose veins interferes with work, daily exercise, shopping and household maintenance. At this point, the symptoms are persistent and severe enough that they're having a negative impact on lifestyle and are interfering with daily activities.  There is no history of DVT, PE or superficial thrombophlebitis. There is no history of ulceration or hemorrhage. The patient denies a significant family history of varicose veins.  The patient has worn graduated compression in the past. At the present time the patient has been using over-the-counter analgesics.  The patient has had sclerotherapy before.  Today noninvasive study showed no evidence of DVT or superficial thrombophlebitis in the left lower extremity.  No evidence of deep venous insufficiency or superficial venous reflux noted in the left lower extremity.     Review of Systems  All other systems reviewed and are negative.      Objective:   Physical Exam Vitals reviewed.  HENT:     Head: Normocephalic.  Cardiovascular:     Rate and Rhythm: Normal rate.     Pulses: Normal pulses.  Musculoskeletal:        General: Tenderness present.  Skin:    General: Skin is warm and dry.  Neurological:     Mental Status: She is alert and oriented to person, place, and time.  Psychiatric:        Mood and Affect: Mood normal.        Behavior: Behavior normal.        Thought Content: Thought content normal.        Judgment: Judgment normal.      BP 136/81 (BP Location: Left Arm)   Pulse 75   Resp 16   Wt 141 lb 12.8 oz (64.3 kg)   BMI 25.94 kg/m   Past Medical History:  Diagnosis Date   Asymptomatic varicose veins    Bronchitis    Bunion of right foot    Collagenous colitis 2021   Complication of anesthesia    stopped breathing in recovery room from morphine   Diverticulosis of colon (without mention of hemorrhage) 2010   Headache(784.0)    Osteoarthrosis, unspecified whether generalized or localized, unspecified site    hands   Osteoporosis, unspecified    Palpitations    PONV (postoperative nausea and vomiting)    Post-menopausal    Post-nasal drainage     Social History   Socioeconomic History   Marital status: Married    Spouse name: Not on file   Number of children: 0   Years of education: Not on file   Highest education level: Bachelor's degree (e.g., BA, AB, BS)  Occupational History   Occupation: VP of HR     Comment: human resources   Tobacco Use   Smoking status: Never   Smokeless tobacco: Never  Vaping Use   Vaping status: Never Used  Substance and Sexual Activity   Alcohol use: Yes    Alcohol/week: 0.0 standard drinks of alcohol    Comment: Drinks wine socially./2-3  drinks per month   Drug use: No   Sexual activity: Yes    Partners: Male    Birth control/protection: Post-menopausal  Other Topics Concern   Not on file  Social History Narrative   Retired at age 36 from [employer] - [place] department April 2021    Social Determinants of Health   Financial Resource Strain: Low Risk  (12/01/2018)   Overall Financial Resource Strain (CARDIA)    Difficulty of Paying Living Expenses: Not hard at all  Food Insecurity: No Food Insecurity (12/01/2018)   Hunger Vital Sign    Worried About Running Out of Food in the Last Year: Never true    Ran Out of Food in the Last Year: Never true  Transportation Needs: No Transportation Needs (12/01/2018)   PRAPARE - Administrator, Civil Service  (Medical): No    Lack of Transportation (Non-Medical): No  Physical Activity: Insufficiently Active (12/01/2018)   Exercise Vital Sign    Days of Exercise per Week: 3 days    Minutes of Exercise per Session: 30 min  Stress: No Stress Concern Present (12/01/2018)   [employer] of Occupational Health - Occupational Stress Questionnaire    Feeling of Stress : Not at all  Social Connections: Not on file  Intimate Partner Violence: Not At Risk (12/01/2018)   Humiliation, Afraid, Rape, and Kick questionnaire    Fear of Current or Ex-Partner: No    Emotionally Abused: No    Physically Abused: No    Sexually Abused: No    Past Surgical History:  Procedure Laterality Date   Quintella Reichert OSTEOTOMY Left 03/19/2018   Procedure: Quintella Reichert OSTEOTOMY-(HALLUX VALGUS-DOUBLE OSTEOTOMY);  Surgeon: Recardo Evangelist, DPM;  Location: [facility];  Service: Podiatry;  Laterality: Left;   BUNIONECTOMY     COLON SURGERY  2010   sigmoid resection/rectopexy   COLONOSCOPY  06/07/14,08/08/08,05/02/03,11/25/94   DILATION AND CURETTAGE OF UTERUS  10/15   diagnostic/therapeutic   ESOPHAGOGASTRODUODENOSCOPY  05/02/2003   METATARSAL OSTEOTOMY Right 01/19/2015   Procedure: METATARSAL OSTEOTOMY DOUBLE 1ST MET & GREAT TOE PROXIMAL PHALANX;  Surgeon: Recardo Evangelist, DPM;  Location: [facility];  Service: Podiatry;  Laterality: Right;  LMA WITH LOCAL   WEIL OSTEOTOMY Right 01/19/2015   Procedure: WEIL OSTEOTOMY 2ND TOE;  Surgeon: Recardo Evangelist, DPM;  Location: [facility];  Service: Podiatry;  Laterality: Right;    Family History  Problem Relation Age of Onset   Arrhythmia Mother        A-Fib   Heart failure Mother    Hypertension Mother    Hyperlipidemia Mother    Colon polyps Mother    Varicose Veins Mother    Hypertension Father    Heart attack Father 74       MI   Arrhythmia Brother        A-Fib   Heart attack Paternal Grandfather 70   Heart disease Paternal Grandfather    Breast cancer  Neg Hx     Allergies  Allergen Reactions   Codeine     Hives Vomiting    Hydrocodone-Acetaminophen Nausea Only and Nausea And Vomiting   Oxycodone Hcl Nausea Only and Nausea And Vomiting   Propoxyphene Nausea Only and Nausea And Vomiting       Latest Ref Rng & Units 11/17/2018   10:19 AM 08/21/2017    3:07 PM 06/20/2015   10:44 AM  CBC  WBC 4.0 - 10.5 K/uL 6.7  8.4  16.2   Hemoglobin 12.0 - 15.0  g/dL 78.2  95.6  21.3   Hematocrit 36.0 - 46.0 % 40.4  43.0  42.8   Platelets 150 - 400 K/uL 197  251  203       CMP     Component Value Date/Time   NA 140 11/17/2018 1019   NA 138 11/16/2013 1135   K 3.9 11/17/2018 1019   K 4.0 11/16/2013 1135   CL 102 11/17/2018 1019   CL 106 11/16/2013 1135   CO2 30 11/17/2018 1019   CO2 26 11/16/2013 1135   GLUCOSE 75 11/17/2018 1019   GLUCOSE 93 11/16/2013 1135   BUN 15 11/17/2018 1019   BUN 16 11/16/2013 1135   CREATININE 0.53 11/17/2018 1019   CREATININE 0.75 11/16/2013 1135   CALCIUM 9.7 11/17/2018 1019   CALCIUM 10.0 11/16/2013 1135   PROT 7.3 11/16/2013 1135   ALBUMIN 3.9 11/16/2013 1135   AST 25 11/16/2013 1135   ALT 30 11/16/2013 1135   ALKPHOS 107 11/16/2013 1135   BILITOT 0.5 11/16/2013 1135   GFRNONAA >60 11/17/2018 1019   GFRNONAA >60 11/16/2013 1135     No results found.     Assessment & Plan:   1. Varicose veins with pain  Recommend:  The patient  has persistent symptoms of pain and swelling that are having a negative impact on daily life and daily activities.  Patient should undergo injection sclerotherapy to treat the residual varicosities.  The risks, benefits and alternative therapies were reviewed in detail with the patient.  All questions were answered.  The patient agrees to proceed with sclerotherapy at their convenience.  The patient will continue wearing the graduated compression stockings and using the over-the-counter pain medications to treat her symptoms.       Current Outpatient  Medications on File Prior to Visit  Medication Sig Dispense Refill   calcium-vitamin D 250-100 MG-UNIT tablet Take 1 tablet by mouth 2 (two) times daily. 60 tablet 0   amoxicillin (AMOXIL) 875 MG tablet Take 1 tablet (875 mg total) by mouth 2 (two) times daily. (Patient not taking: Reported on 06/27/2020) 14 tablet 0   budesonide (ENTOCORT EC) 3 MG 24 hr capsule Take 9 mg by mouth daily.     colestipol (COLESTID) 1 g tablet Take 4 g by mouth daily. (Patient not taking: Reported on 06/27/2020)     dicyclomine (BENTYL) 10 MG capsule Take 1 capsule (10 mg total) by mouth 4 (four) times daily -  before meals and at bedtime. (Patient not taking: Reported on 06/27/2020) 40 capsule 0   EPINEPHrine 0.3 mg/0.3 mL IJ SOAJ injection INJECT INTRAMUSCULARLY AS DIRECTED (Patient not taking: Reported on 06/27/2020)     famotidine (PEPCID) 40 MG tablet TK 1 T PO BID (Patient not taking: Reported on 06/27/2020)     Nutritional Supplements (JUICE PLUS FIBRE PO) Take by mouth. (Patient not taking: Reported on 06/27/2020)     No current facility-administered medications on file prior to visit.    There are no Patient Instructions on file for this visit. No follow-ups on file.   Georgiana Spinner, NP

## 2023-07-22 ENCOUNTER — Encounter (INDEPENDENT_AMBULATORY_CARE_PROVIDER_SITE_OTHER): Payer: Self-pay

## 2023-07-22 ENCOUNTER — Other Ambulatory Visit: Payer: Self-pay | Admitting: Obstetrics and Gynecology

## 2023-07-22 DIAGNOSIS — Z1231 Encounter for screening mammogram for malignant neoplasm of breast: Secondary | ICD-10-CM

## 2023-10-07 ENCOUNTER — Ambulatory Visit
Admission: RE | Admit: 2023-10-07 | Discharge: 2023-10-07 | Disposition: A | Discharge status: Home / Self Care | Payer: Self-pay | Source: Ambulatory Visit | Attending: Obstetrics and Gynecology | Admitting: Obstetrics and Gynecology

## 2023-10-07 DIAGNOSIS — Z1231 Encounter for screening mammogram for malignant neoplasm of breast: Secondary | ICD-10-CM | POA: Diagnosis present

## 2023-10-15 ENCOUNTER — Ambulatory Visit: Payer: Self-pay | Admitting: Obstetrics and Gynecology
